# Patient Record
Sex: Male | Born: 1950
Health system: Southern US, Community
[De-identification: ages and names within clinical notes are randomized; demographics above are authoritative.]

## PROBLEM LIST (undated history)

## (undated) DIAGNOSIS — I10 Essential (primary) hypertension: Secondary | ICD-10-CM

## (undated) DIAGNOSIS — C801 Malignant (primary) neoplasm, unspecified: Secondary | ICD-10-CM

## (undated) HISTORY — PX: BACK SURGERY: SHX140

## (undated) HISTORY — PX: HEMORROIDECTOMY: SUR656

## (undated) HISTORY — PX: TRANSURETHRAL RESECTION OF PROSTATE: SHX73

---

## 1998-12-10 ENCOUNTER — Inpatient Hospital Stay (HOSPITAL_COMMUNITY): Admission: RE | Admit: 1998-12-10 | Discharge: 1998-12-11 | Payer: Self-pay | Admitting: Neurosurgery

## 2005-01-14 ENCOUNTER — Ambulatory Visit: Payer: Self-pay | Admitting: Family Medicine

## 2005-08-18 ENCOUNTER — Ambulatory Visit: Payer: Self-pay | Admitting: Family Medicine

## 2016-09-22 DIAGNOSIS — Z79899 Other long term (current) drug therapy: Secondary | ICD-10-CM | POA: Diagnosis not present

## 2016-09-22 DIAGNOSIS — E785 Hyperlipidemia, unspecified: Secondary | ICD-10-CM | POA: Diagnosis not present

## 2016-09-22 DIAGNOSIS — R739 Hyperglycemia, unspecified: Secondary | ICD-10-CM | POA: Diagnosis not present

## 2016-10-23 DIAGNOSIS — J0100 Acute maxillary sinusitis, unspecified: Secondary | ICD-10-CM | POA: Diagnosis not present

## 2016-10-23 DIAGNOSIS — R05 Cough: Secondary | ICD-10-CM | POA: Diagnosis not present

## 2016-10-23 DIAGNOSIS — R062 Wheezing: Secondary | ICD-10-CM | POA: Diagnosis not present

## 2017-01-25 DIAGNOSIS — R972 Elevated prostate specific antigen [PSA]: Secondary | ICD-10-CM | POA: Diagnosis not present

## 2017-01-25 DIAGNOSIS — N401 Enlarged prostate with lower urinary tract symptoms: Secondary | ICD-10-CM | POA: Diagnosis not present

## 2017-01-25 DIAGNOSIS — R351 Nocturia: Secondary | ICD-10-CM | POA: Diagnosis not present

## 2017-01-25 DIAGNOSIS — N3020 Other chronic cystitis without hematuria: Secondary | ICD-10-CM | POA: Diagnosis not present

## 2017-03-31 DIAGNOSIS — Z6831 Body mass index (BMI) 31.0-31.9, adult: Secondary | ICD-10-CM | POA: Diagnosis not present

## 2017-03-31 DIAGNOSIS — L049 Acute lymphadenitis, unspecified: Secondary | ICD-10-CM | POA: Diagnosis not present

## 2017-07-25 DIAGNOSIS — N318 Other neuromuscular dysfunction of bladder: Secondary | ICD-10-CM | POA: Diagnosis not present

## 2017-07-25 DIAGNOSIS — N401 Enlarged prostate with lower urinary tract symptoms: Secondary | ICD-10-CM | POA: Diagnosis not present

## 2017-07-25 DIAGNOSIS — N3020 Other chronic cystitis without hematuria: Secondary | ICD-10-CM | POA: Diagnosis not present

## 2017-07-25 DIAGNOSIS — R972 Elevated prostate specific antigen [PSA]: Secondary | ICD-10-CM | POA: Diagnosis not present

## 2017-07-26 DIAGNOSIS — Z131 Encounter for screening for diabetes mellitus: Secondary | ICD-10-CM | POA: Diagnosis not present

## 2017-07-26 DIAGNOSIS — Z6830 Body mass index (BMI) 30.0-30.9, adult: Secondary | ICD-10-CM | POA: Diagnosis not present

## 2017-07-26 DIAGNOSIS — Z79899 Other long term (current) drug therapy: Secondary | ICD-10-CM | POA: Diagnosis not present

## 2017-07-26 DIAGNOSIS — D159 Benign neoplasm of intrathoracic organ, unspecified: Secondary | ICD-10-CM | POA: Diagnosis not present

## 2017-07-26 DIAGNOSIS — Z1389 Encounter for screening for other disorder: Secondary | ICD-10-CM | POA: Diagnosis not present

## 2017-07-26 DIAGNOSIS — Z0000 Encounter for general adult medical examination without abnormal findings: Secondary | ICD-10-CM | POA: Diagnosis not present

## 2017-07-26 DIAGNOSIS — Z9181 History of falling: Secondary | ICD-10-CM | POA: Diagnosis not present

## 2017-10-19 DIAGNOSIS — L723 Sebaceous cyst: Secondary | ICD-10-CM | POA: Diagnosis not present

## 2017-10-19 DIAGNOSIS — I10 Essential (primary) hypertension: Secondary | ICD-10-CM | POA: Diagnosis not present

## 2017-10-19 DIAGNOSIS — L02212 Cutaneous abscess of back [any part, except buttock]: Secondary | ICD-10-CM | POA: Diagnosis not present

## 2018-02-01 DIAGNOSIS — N401 Enlarged prostate with lower urinary tract symptoms: Secondary | ICD-10-CM | POA: Diagnosis not present

## 2018-02-01 DIAGNOSIS — N318 Other neuromuscular dysfunction of bladder: Secondary | ICD-10-CM | POA: Diagnosis not present

## 2018-02-01 DIAGNOSIS — R972 Elevated prostate specific antigen [PSA]: Secondary | ICD-10-CM | POA: Diagnosis not present

## 2018-02-01 DIAGNOSIS — N3020 Other chronic cystitis without hematuria: Secondary | ICD-10-CM | POA: Diagnosis not present

## 2018-03-06 DIAGNOSIS — D510 Vitamin B12 deficiency anemia due to intrinsic factor deficiency: Secondary | ICD-10-CM | POA: Diagnosis not present

## 2018-03-06 DIAGNOSIS — Z6830 Body mass index (BMI) 30.0-30.9, adult: Secondary | ICD-10-CM | POA: Diagnosis not present

## 2018-03-06 DIAGNOSIS — I10 Essential (primary) hypertension: Secondary | ICD-10-CM | POA: Diagnosis not present

## 2018-03-06 DIAGNOSIS — E785 Hyperlipidemia, unspecified: Secondary | ICD-10-CM | POA: Diagnosis not present

## 2018-03-06 DIAGNOSIS — R5383 Other fatigue: Secondary | ICD-10-CM | POA: Diagnosis not present

## 2018-03-06 DIAGNOSIS — Z79899 Other long term (current) drug therapy: Secondary | ICD-10-CM | POA: Diagnosis not present

## 2018-07-27 DIAGNOSIS — Z6830 Body mass index (BMI) 30.0-30.9, adult: Secondary | ICD-10-CM | POA: Diagnosis not present

## 2018-07-27 DIAGNOSIS — Z1339 Encounter for screening examination for other mental health and behavioral disorders: Secondary | ICD-10-CM | POA: Diagnosis not present

## 2018-07-27 DIAGNOSIS — Z122 Encounter for screening for malignant neoplasm of respiratory organs: Secondary | ICD-10-CM | POA: Diagnosis not present

## 2018-07-27 DIAGNOSIS — I10 Essential (primary) hypertension: Secondary | ICD-10-CM | POA: Diagnosis not present

## 2018-07-27 DIAGNOSIS — Z0000 Encounter for general adult medical examination without abnormal findings: Secondary | ICD-10-CM | POA: Diagnosis not present

## 2018-07-27 DIAGNOSIS — D510 Vitamin B12 deficiency anemia due to intrinsic factor deficiency: Secondary | ICD-10-CM | POA: Diagnosis not present

## 2018-07-27 DIAGNOSIS — Z9119 Patient's noncompliance with other medical treatment and regimen: Secondary | ICD-10-CM | POA: Diagnosis not present

## 2018-07-27 DIAGNOSIS — Z23 Encounter for immunization: Secondary | ICD-10-CM | POA: Diagnosis not present

## 2018-08-01 DIAGNOSIS — N318 Other neuromuscular dysfunction of bladder: Secondary | ICD-10-CM | POA: Diagnosis not present

## 2018-08-01 DIAGNOSIS — N3020 Other chronic cystitis without hematuria: Secondary | ICD-10-CM | POA: Diagnosis not present

## 2018-08-01 DIAGNOSIS — R972 Elevated prostate specific antigen [PSA]: Secondary | ICD-10-CM | POA: Diagnosis not present

## 2018-08-01 DIAGNOSIS — N401 Enlarged prostate with lower urinary tract symptoms: Secondary | ICD-10-CM | POA: Diagnosis not present

## 2018-08-02 DIAGNOSIS — I700 Atherosclerosis of aorta: Secondary | ICD-10-CM | POA: Diagnosis not present

## 2018-08-02 DIAGNOSIS — J439 Emphysema, unspecified: Secondary | ICD-10-CM | POA: Diagnosis not present

## 2018-08-02 DIAGNOSIS — Z87891 Personal history of nicotine dependence: Secondary | ICD-10-CM | POA: Diagnosis not present

## 2018-08-02 DIAGNOSIS — Z122 Encounter for screening for malignant neoplasm of respiratory organs: Secondary | ICD-10-CM | POA: Diagnosis not present

## 2018-11-04 DIAGNOSIS — E782 Mixed hyperlipidemia: Secondary | ICD-10-CM | POA: Diagnosis not present

## 2018-11-04 DIAGNOSIS — I10 Essential (primary) hypertension: Secondary | ICD-10-CM | POA: Diagnosis not present

## 2019-01-10 DIAGNOSIS — E7800 Pure hypercholesterolemia, unspecified: Secondary | ICD-10-CM | POA: Diagnosis not present

## 2019-01-10 DIAGNOSIS — Z6831 Body mass index (BMI) 31.0-31.9, adult: Secondary | ICD-10-CM | POA: Diagnosis not present

## 2019-01-10 DIAGNOSIS — Z9119 Patient's noncompliance with other medical treatment and regimen: Secondary | ICD-10-CM | POA: Diagnosis not present

## 2019-01-10 DIAGNOSIS — I10 Essential (primary) hypertension: Secondary | ICD-10-CM | POA: Diagnosis not present

## 2019-01-30 DIAGNOSIS — N318 Other neuromuscular dysfunction of bladder: Secondary | ICD-10-CM | POA: Diagnosis not present

## 2019-01-30 DIAGNOSIS — N3020 Other chronic cystitis without hematuria: Secondary | ICD-10-CM | POA: Diagnosis not present

## 2019-01-30 DIAGNOSIS — R972 Elevated prostate specific antigen [PSA]: Secondary | ICD-10-CM | POA: Diagnosis not present

## 2019-01-30 DIAGNOSIS — N401 Enlarged prostate with lower urinary tract symptoms: Secondary | ICD-10-CM | POA: Diagnosis not present

## 2019-08-03 DIAGNOSIS — R972 Elevated prostate specific antigen [PSA]: Secondary | ICD-10-CM | POA: Diagnosis not present

## 2019-08-03 DIAGNOSIS — N401 Enlarged prostate with lower urinary tract symptoms: Secondary | ICD-10-CM | POA: Diagnosis not present

## 2019-08-03 DIAGNOSIS — N3020 Other chronic cystitis without hematuria: Secondary | ICD-10-CM | POA: Diagnosis not present

## 2019-08-03 DIAGNOSIS — N318 Other neuromuscular dysfunction of bladder: Secondary | ICD-10-CM | POA: Diagnosis not present

## 2019-08-08 DIAGNOSIS — Z6831 Body mass index (BMI) 31.0-31.9, adult: Secondary | ICD-10-CM | POA: Diagnosis not present

## 2019-08-08 DIAGNOSIS — D510 Vitamin B12 deficiency anemia due to intrinsic factor deficiency: Secondary | ICD-10-CM | POA: Diagnosis not present

## 2019-08-08 DIAGNOSIS — Z9119 Patient's noncompliance with other medical treatment and regimen: Secondary | ICD-10-CM | POA: Diagnosis not present

## 2019-08-08 DIAGNOSIS — Z2821 Immunization not carried out because of patient refusal: Secondary | ICD-10-CM | POA: Diagnosis not present

## 2019-08-08 DIAGNOSIS — Z9181 History of falling: Secondary | ICD-10-CM | POA: Diagnosis not present

## 2019-08-08 DIAGNOSIS — Z0000 Encounter for general adult medical examination without abnormal findings: Secondary | ICD-10-CM | POA: Diagnosis not present

## 2019-08-08 DIAGNOSIS — Z1331 Encounter for screening for depression: Secondary | ICD-10-CM | POA: Diagnosis not present

## 2019-08-08 DIAGNOSIS — I10 Essential (primary) hypertension: Secondary | ICD-10-CM | POA: Diagnosis not present

## 2019-08-08 DIAGNOSIS — E7800 Pure hypercholesterolemia, unspecified: Secondary | ICD-10-CM | POA: Diagnosis not present

## 2019-08-08 DIAGNOSIS — Z23 Encounter for immunization: Secondary | ICD-10-CM | POA: Diagnosis not present

## 2019-12-26 DIAGNOSIS — I10 Essential (primary) hypertension: Secondary | ICD-10-CM | POA: Diagnosis not present

## 2019-12-26 DIAGNOSIS — K6289 Other specified diseases of anus and rectum: Secondary | ICD-10-CM | POA: Diagnosis not present

## 2019-12-26 DIAGNOSIS — K643 Fourth degree hemorrhoids: Secondary | ICD-10-CM | POA: Diagnosis not present

## 2020-01-02 DIAGNOSIS — Z01812 Encounter for preprocedural laboratory examination: Secondary | ICD-10-CM | POA: Diagnosis not present

## 2020-01-02 DIAGNOSIS — K6289 Other specified diseases of anus and rectum: Secondary | ICD-10-CM | POA: Diagnosis not present

## 2020-01-02 DIAGNOSIS — K643 Fourth degree hemorrhoids: Secondary | ICD-10-CM | POA: Diagnosis not present

## 2020-01-02 DIAGNOSIS — Z20822 Contact with and (suspected) exposure to covid-19: Secondary | ICD-10-CM | POA: Diagnosis not present

## 2020-01-02 DIAGNOSIS — I10 Essential (primary) hypertension: Secondary | ICD-10-CM | POA: Diagnosis not present

## 2020-01-02 DIAGNOSIS — K648 Other hemorrhoids: Secondary | ICD-10-CM | POA: Diagnosis not present

## 2020-01-04 DIAGNOSIS — Z01810 Encounter for preprocedural cardiovascular examination: Secondary | ICD-10-CM | POA: Diagnosis not present

## 2020-01-07 DIAGNOSIS — I10 Essential (primary) hypertension: Secondary | ICD-10-CM | POA: Diagnosis not present

## 2020-01-07 DIAGNOSIS — K6289 Other specified diseases of anus and rectum: Secondary | ICD-10-CM | POA: Diagnosis not present

## 2020-01-07 DIAGNOSIS — K643 Fourth degree hemorrhoids: Secondary | ICD-10-CM | POA: Diagnosis not present

## 2020-01-07 DIAGNOSIS — Z87891 Personal history of nicotine dependence: Secondary | ICD-10-CM | POA: Diagnosis not present

## 2020-05-06 DIAGNOSIS — I10 Essential (primary) hypertension: Secondary | ICD-10-CM | POA: Diagnosis not present

## 2020-05-06 DIAGNOSIS — D510 Vitamin B12 deficiency anemia due to intrinsic factor deficiency: Secondary | ICD-10-CM | POA: Diagnosis not present

## 2020-05-06 DIAGNOSIS — Z6830 Body mass index (BMI) 30.0-30.9, adult: Secondary | ICD-10-CM | POA: Diagnosis not present

## 2020-05-06 DIAGNOSIS — Z9119 Patient's noncompliance with other medical treatment and regimen: Secondary | ICD-10-CM | POA: Diagnosis not present

## 2020-05-06 DIAGNOSIS — E7800 Pure hypercholesterolemia, unspecified: Secondary | ICD-10-CM | POA: Diagnosis not present

## 2020-10-02 DIAGNOSIS — R972 Elevated prostate specific antigen [PSA]: Secondary | ICD-10-CM | POA: Diagnosis not present

## 2020-10-02 DIAGNOSIS — Z1331 Encounter for screening for depression: Secondary | ICD-10-CM | POA: Diagnosis not present

## 2020-10-02 DIAGNOSIS — I10 Essential (primary) hypertension: Secondary | ICD-10-CM | POA: Diagnosis not present

## 2020-10-02 DIAGNOSIS — E7800 Pure hypercholesterolemia, unspecified: Secondary | ICD-10-CM | POA: Diagnosis not present

## 2020-10-02 DIAGNOSIS — D510 Vitamin B12 deficiency anemia due to intrinsic factor deficiency: Secondary | ICD-10-CM | POA: Diagnosis not present

## 2020-10-02 DIAGNOSIS — Z0000 Encounter for general adult medical examination without abnormal findings: Secondary | ICD-10-CM | POA: Diagnosis not present

## 2020-10-02 DIAGNOSIS — Z2821 Immunization not carried out because of patient refusal: Secondary | ICD-10-CM | POA: Diagnosis not present

## 2020-10-02 DIAGNOSIS — M1990 Unspecified osteoarthritis, unspecified site: Secondary | ICD-10-CM | POA: Diagnosis not present

## 2020-10-02 DIAGNOSIS — Z6830 Body mass index (BMI) 30.0-30.9, adult: Secondary | ICD-10-CM | POA: Diagnosis not present

## 2020-10-02 DIAGNOSIS — N529 Male erectile dysfunction, unspecified: Secondary | ICD-10-CM | POA: Diagnosis not present

## 2020-12-09 DIAGNOSIS — R972 Elevated prostate specific antigen [PSA]: Secondary | ICD-10-CM | POA: Diagnosis not present

## 2020-12-09 DIAGNOSIS — N401 Enlarged prostate with lower urinary tract symptoms: Secondary | ICD-10-CM | POA: Diagnosis not present

## 2020-12-10 ENCOUNTER — Other Ambulatory Visit: Payer: Self-pay | Admitting: Family Medicine

## 2020-12-10 ENCOUNTER — Other Ambulatory Visit: Payer: Self-pay | Admitting: Urology

## 2020-12-10 DIAGNOSIS — R972 Elevated prostate specific antigen [PSA]: Secondary | ICD-10-CM

## 2020-12-19 ENCOUNTER — Other Ambulatory Visit: Payer: Self-pay

## 2020-12-19 ENCOUNTER — Ambulatory Visit
Admission: RE | Admit: 2020-12-19 | Discharge: 2020-12-19 | Disposition: A | Discharge status: Home / Self Care | Payer: PPO | Source: Ambulatory Visit | Attending: Family Medicine | Admitting: Family Medicine

## 2020-12-19 DIAGNOSIS — R937 Abnormal findings on diagnostic imaging of other parts of musculoskeletal system: Secondary | ICD-10-CM | POA: Diagnosis not present

## 2020-12-19 DIAGNOSIS — R972 Elevated prostate specific antigen [PSA]: Secondary | ICD-10-CM

## 2020-12-19 DIAGNOSIS — K5730 Diverticulosis of large intestine without perforation or abscess without bleeding: Secondary | ICD-10-CM | POA: Diagnosis not present

## 2020-12-19 DIAGNOSIS — N400 Enlarged prostate without lower urinary tract symptoms: Secondary | ICD-10-CM | POA: Diagnosis not present

## 2020-12-19 MED ORDER — GADOBENATE DIMEGLUMINE 529 MG/ML IV SOLN
20.0000 mL | Freq: Once | INTRAVENOUS | Status: AC | PRN
  Administered 2020-12-19: 20 mL via INTRAVENOUS

## 2021-01-02 DIAGNOSIS — R972 Elevated prostate specific antigen [PSA]: Secondary | ICD-10-CM | POA: Diagnosis not present

## 2021-01-02 DIAGNOSIS — C61 Malignant neoplasm of prostate: Secondary | ICD-10-CM | POA: Diagnosis not present

## 2021-01-15 DIAGNOSIS — C61 Malignant neoplasm of prostate: Secondary | ICD-10-CM | POA: Diagnosis not present

## 2021-01-19 ENCOUNTER — Other Ambulatory Visit (HOSPITAL_COMMUNITY): Payer: Self-pay | Admitting: Urology

## 2021-01-19 DIAGNOSIS — C61 Malignant neoplasm of prostate: Secondary | ICD-10-CM

## 2021-01-26 ENCOUNTER — Ambulatory Visit (HOSPITAL_COMMUNITY)
Admission: RE | Admit: 2021-01-26 | Discharge: 2021-01-26 | Disposition: A | Discharge status: Home / Self Care | Payer: PPO | Source: Ambulatory Visit | Attending: Urology | Admitting: Urology

## 2021-01-26 ENCOUNTER — Encounter (HOSPITAL_COMMUNITY): Payer: Self-pay

## 2021-01-26 ENCOUNTER — Other Ambulatory Visit (HOSPITAL_COMMUNITY): Payer: Self-pay | Admitting: Urology

## 2021-01-26 ENCOUNTER — Other Ambulatory Visit: Payer: Self-pay

## 2021-01-26 DIAGNOSIS — K760 Fatty (change of) liver, not elsewhere classified: Secondary | ICD-10-CM | POA: Diagnosis not present

## 2021-01-26 DIAGNOSIS — C61 Malignant neoplasm of prostate: Secondary | ICD-10-CM

## 2021-01-26 DIAGNOSIS — R160 Hepatomegaly, not elsewhere classified: Secondary | ICD-10-CM | POA: Diagnosis not present

## 2021-01-26 HISTORY — DX: Malignant (primary) neoplasm, unspecified: C80.1

## 2021-01-26 HISTORY — DX: Essential (primary) hypertension: I10

## 2021-01-26 LAB — POCT I-STAT CREATININE: Creatinine, Ser: 0.9 mg/dL (ref 0.61–1.24)

## 2021-01-26 MED ORDER — AXUMIN (FLUCICLOVINE F 18) INJECTION
9.9900 | Freq: Once | INTRAVENOUS | Status: AC | PRN
  Administered 2021-01-26: 9.99 via INTRAVENOUS

## 2021-01-26 MED ORDER — IOHEXOL 300 MG/ML  SOLN
100.0000 mL | Freq: Once | INTRAMUSCULAR | Status: AC | PRN
  Administered 2021-01-26: 100 mL via INTRAVENOUS

## 2021-02-17 DIAGNOSIS — R9341 Abnormal radiologic findings on diagnostic imaging of renal pelvis, ureter, or bladder: Secondary | ICD-10-CM | POA: Diagnosis not present

## 2021-02-17 DIAGNOSIS — C61 Malignant neoplasm of prostate: Secondary | ICD-10-CM | POA: Diagnosis not present

## 2021-02-24 DIAGNOSIS — Z01818 Encounter for other preprocedural examination: Secondary | ICD-10-CM | POA: Diagnosis not present

## 2021-02-26 DIAGNOSIS — Z8546 Personal history of malignant neoplasm of prostate: Secondary | ICD-10-CM | POA: Diagnosis not present

## 2021-02-26 DIAGNOSIS — K219 Gastro-esophageal reflux disease without esophagitis: Secondary | ICD-10-CM | POA: Diagnosis not present

## 2021-02-26 DIAGNOSIS — Z6831 Body mass index (BMI) 31.0-31.9, adult: Secondary | ICD-10-CM | POA: Diagnosis not present

## 2021-02-26 DIAGNOSIS — E785 Hyperlipidemia, unspecified: Secondary | ICD-10-CM | POA: Diagnosis not present

## 2021-02-26 DIAGNOSIS — R9341 Abnormal radiologic findings on diagnostic imaging of renal pelvis, ureter, or bladder: Secondary | ICD-10-CM | POA: Diagnosis not present

## 2021-02-26 DIAGNOSIS — Z87891 Personal history of nicotine dependence: Secondary | ICD-10-CM | POA: Diagnosis not present

## 2021-02-26 DIAGNOSIS — E669 Obesity, unspecified: Secondary | ICD-10-CM | POA: Diagnosis not present

## 2021-02-26 DIAGNOSIS — I10 Essential (primary) hypertension: Secondary | ICD-10-CM | POA: Diagnosis not present

## 2021-02-26 DIAGNOSIS — Z79899 Other long term (current) drug therapy: Secondary | ICD-10-CM | POA: Diagnosis not present

## 2021-02-26 DIAGNOSIS — Q630 Accessory kidney: Secondary | ICD-10-CM | POA: Diagnosis not present

## 2021-02-26 DIAGNOSIS — R93422 Abnormal radiologic findings on diagnostic imaging of left kidney: Secondary | ICD-10-CM | POA: Diagnosis not present

## 2021-03-03 DIAGNOSIS — C61 Malignant neoplasm of prostate: Secondary | ICD-10-CM | POA: Diagnosis not present

## 2021-03-06 ENCOUNTER — Other Ambulatory Visit: Payer: Self-pay | Admitting: Urology

## 2021-03-12 NOTE — Patient Instructions (Addendum)
DUE TO COVID-19 ONLY ONE VISITOR IS ALLOWED TO COME WITH YOU AND STAY IN THE WAITING ROOM ONLY DURING PRE OP AND PROCEDURE DAY OF SURGERY. THE 1 VISITOR  MAY VISIT WITH YOU AFTER SURGERY IN YOUR PRIVATE ROOM DURING VISITING HOURS ONLY!  YOU NEED TO HAVE A COVID 19 TEST ON: 03/16/21 @ 11:30 AM , THIS TEST MUST BE DONE BEFORE SURGERY,  COVID TESTING SITE [name] [id number], IT IS ON THE RIGHT GOING OUT [address] APPROXIMATELY  2 MINUTES PAST [employer] ON THE RIGHT. ONCE YOUR COVID TEST IS COMPLETED,  PLEASE BEGIN THE QUARANTINE INSTRUCTIONS AS OUTLINED IN YOUR HANDOUT.                Joe Jones    Your procedure is scheduled on: 03/19/21   Report to [facility] Main  Entrance   Report to short stay at: 5:15 AM     Call this number if you have problems the morning of surgery [phone]    Remember: Do not eat food or drink liquids :After Midnight.   BRUSH YOUR TEETH MORNING OF SURGERY AND RINSE YOUR MOUTH OUT, NO CHEWING GUM CANDY OR MINTS.    Take these medicines the morning of surgery with A SIP OF WATER: amlodipine.                               You may not have any metal on your body including hair pins and              piercings  Do not wear jewelry, lotions, powders or perfumes, deodorant             Men may shave face and neck.   Do not bring valuables to the hospital. [name]  Contacts, dentures or bridgework may not be worn into surgery.  Leave suitcase in the car. After surgery it may be brought to your room.     Patients discharged the day of surgery will not be allowed to drive home. IF YOU ARE HAVING SURGERY AND GOING HOME THE SAME DAY, YOU MUST HAVE AN ADULT TO DRIVE YOU HOME AND BE WITH YOU FOR 24 HOURS. YOU MAY GO HOME BY TAXI OR UBER OR ORTHERWISE, BUT AN ADULT MUST ACCOMPANY YOU HOME AND STAY WITH YOU FOR 24 HOURS.  Name and phone number of your  driver:  Special Instructions: N/A              Please read over the following fact sheets you were given: ____________________________________________________________________          [facility] - Preparing for Surgery Before surgery, you can play an important role.  Because skin is not sterile, your skin needs to be as free of germs as possible.  You can reduce the number of germs on your skin by washing with CHG (chlorahexidine gluconate) soap before surgery.  CHG is an antiseptic cleaner which kills germs and bonds with the skin to continue killing germs even after washing. Please DO NOT use if you have an allergy to CHG or antibacterial soaps.  If your skin becomes reddened/irritated stop using the CHG and inform your nurse when you arrive at Short Stay. Do not shave (including legs and underarms) for at least 48 hours prior to  the first CHG shower.  You may shave your face/neck. Please follow these instructions carefully:  1.  Shower with CHG Soap the night before surgery and the  morning of Surgery.  2.  If you choose to wash your hair, wash your hair first as usual with your  normal  shampoo.  3.  After you shampoo, rinse your hair and body thoroughly to remove the  shampoo.                           4.  Use CHG as you would any other liquid soap.  You can apply chg directly  to the skin and wash                       Gently with a scrungie or clean washcloth.  5.  Apply the CHG Soap to your body ONLY FROM THE NECK DOWN.   Do not use on face/ open                           Wound or open sores. Avoid contact with eyes, ears mouth and genitals (private parts).                       Wash face,  Genitals (private parts) with your normal soap.             6.  Wash thoroughly, paying special attention to the area where your surgery  will be performed.  7.  Thoroughly rinse your body with warm water from the neck down.  8.  DO NOT shower/wash with your normal soap after using and rinsing off   the CHG Soap.                9.  Pat yourself dry with a clean towel.            10.  Wear clean pajamas.            11.  Place clean sheets on your bed the night of your first shower and do not  sleep with pets. Day of Surgery : Do not apply any lotions/deodorants the morning of surgery.  Please wear clean clothes to the hospital/surgery center.  FAILURE TO FOLLOW THESE INSTRUCTIONS MAY RESULT IN THE CANCELLATION OF YOUR SURGERY PATIENT SIGNATURE_________________________________  NURSE SIGNATURE__________________________________  ________________________________________________________________________

## 2021-03-13 ENCOUNTER — Other Ambulatory Visit: Payer: Self-pay

## 2021-03-13 ENCOUNTER — Encounter (HOSPITAL_COMMUNITY)
Admission: RE | Admit: 2021-03-13 | Discharge: 2021-03-13 | Disposition: A | Discharge status: Home / Self Care | Payer: PPO | Source: Ambulatory Visit | Attending: Urology | Admitting: Urology

## 2021-03-13 ENCOUNTER — Encounter (HOSPITAL_COMMUNITY): Payer: Self-pay

## 2021-03-13 DIAGNOSIS — Z01818 Encounter for other preprocedural examination: Secondary | ICD-10-CM | POA: Diagnosis not present

## 2021-03-13 LAB — CBC
HCT: 44 % (ref 39.0–52.0)
Hemoglobin: 15.4 g/dL (ref 13.0–17.0)
MCH: 34.2 pg — ABNORMAL HIGH (ref 26.0–34.0)
MCHC: 35 g/dL (ref 30.0–36.0)
MCV: 97.8 fL (ref 80.0–100.0)
Platelets: 304 10*3/uL (ref 150–400)
RBC: 4.5 MIL/uL (ref 4.22–5.81)
RDW: 12.7 % (ref 11.5–15.5)
WBC: 7.9 10*3/uL (ref 4.0–10.5)
nRBC: 0 % (ref 0.0–0.2)

## 2021-03-13 LAB — BASIC METABOLIC PANEL
Anion gap: 10 (ref 5–15)
BUN: 14 mg/dL (ref 8–23)
CO2: 25 mmol/L (ref 22–32)
Calcium: 9.6 mg/dL (ref 8.9–10.3)
Chloride: 102 mmol/L (ref 98–111)
Creatinine, Ser: 0.98 mg/dL (ref 0.61–1.24)
GFR, Estimated: >60 mL/min (ref >60)
Glucose, Bld: 124 mg/dL — ABNORMAL HIGH (ref 70–99)
Potassium: 4.6 mmol/L (ref 3.5–5.1)
Sodium: 137 mmol/L (ref 135–145)

## 2021-03-13 NOTE — Progress Notes (Signed)
COVID Vaccine Completed: Yes Date COVID Vaccine completed: 09/06/20 COVID vaccine manufacturer:  Moderna     PCP - Dr. Lovette Cliche Cardiologist -   Chest x-ray -  EKG -  Stress Test -  ECHO -  Cardiac Cath -  Pacemaker/ICD device last checked:  Sleep Study -  CPAP -   Fasting Blood Sugar -  Checks Blood Sugar _____ times a day  Blood Thinner Instructions: Aspirin Instructions: Last Dose:  Anesthesia review:   Patient denies shortness of breath, fever, cough and chest pain at PAT appointment   Patient verbalized understanding of instructions that were given to them at the PAT appointment. Patient was also instructed that they will need to review over the PAT instructions again at home before surgery.

## 2021-03-16 ENCOUNTER — Other Ambulatory Visit (HOSPITAL_COMMUNITY)
Admission: RE | Admit: 2021-03-16 | Discharge: 2021-03-16 | Disposition: A | Discharge status: Home / Self Care | Payer: PPO | Source: Ambulatory Visit | Attending: Urology | Admitting: Urology

## 2021-03-16 DIAGNOSIS — Z01812 Encounter for preprocedural laboratory examination: Secondary | ICD-10-CM | POA: Insufficient documentation

## 2021-03-16 DIAGNOSIS — Z20822 Contact with and (suspected) exposure to covid-19: Secondary | ICD-10-CM | POA: Insufficient documentation

## 2021-03-16 LAB — SARS CORONAVIRUS 2 (TAT 6-24 HRS): SARS Coronavirus 2: NEGATIVE

## 2021-03-17 DIAGNOSIS — M6281 Muscle weakness (generalized): Secondary | ICD-10-CM | POA: Diagnosis not present

## 2021-03-17 DIAGNOSIS — M6289 Other specified disorders of muscle: Secondary | ICD-10-CM | POA: Diagnosis not present

## 2021-03-17 DIAGNOSIS — M62838 Other muscle spasm: Secondary | ICD-10-CM | POA: Diagnosis not present

## 2021-03-17 DIAGNOSIS — C61 Malignant neoplasm of prostate: Secondary | ICD-10-CM | POA: Diagnosis not present

## 2021-03-18 NOTE — Anesthesia Preprocedure Evaluation (Addendum)
Anesthesia Evaluation  Patient identified by MRN, date of birth, ID band Patient awake    Reviewed: Allergy & Precautions, NPO status , Patient's Chart, lab work & pertinent test results  Airway Mallampati: III  TM Distance: >3 FB Neck ROM: Full    Dental  (+) Chipped,    Pulmonary former smoker,    Pulmonary exam normal breath sounds clear to auscultation       Cardiovascular hypertension, Pt. on medications Normal cardiovascular exam Rhythm:Regular Rate:Normal     Neuro/Psych negative neurological ROS  negative psych ROS   GI/Hepatic negative GI ROS, Neg liver ROS,   Endo/Other  negative endocrine ROS  Renal/GU negative Renal ROS     Musculoskeletal negative musculoskeletal ROS (+)   Abdominal (+) + obese,   Peds  Hematology HLD   Anesthesia Other Findings PROSTATE CANCER  Reproductive/Obstetrics                           Anesthesia Physical Anesthesia Plan  ASA: II  Anesthesia Plan: General   Post-op Pain Management:    Induction: Intravenous  PONV Risk Score and Plan: 3 and Ondansetron, Dexamethasone, Midazolam and Treatment may vary due to age or medical condition  Airway Management Planned: Oral ETT  Additional Equipment:   Intra-op Plan:   Post-operative Plan: Extubation in OR  Informed Consent: I have reviewed the patients History and Physical, chart, labs and discussed the procedure including the risks, benefits and alternatives for the proposed anesthesia with the patient or authorized representative who has indicated his/her understanding and acceptance.     Dental advisory given  Plan Discussed with: CRNA  Anesthesia Plan Comments:        Anesthesia Quick Evaluation

## 2021-03-18 NOTE — H&P (Signed)
Office Visit Report     03/03/2021   --------------------------------------------------------------------------------   Joe Jones  MRN: [id number]  DOB: May 12, 1951, 70 year old Male  SSN:    PRIMARY CARE:    REFERRING:  Joie Bimler, MD  PROVIDER:  Festus Aloe, M.D.  TREATING:  Raynelle Bring, M.D.  LOCATION:  [facility] [phone]     --------------------------------------------------------------------------------   CC/HPI: CC: Prostate Cancer   Physician requesting consult: Dr. Joie Bimler  PCP: Dr. Lovette Cliche   [name] is a 70 year old gentleman who was found to have an elevated PSA of 21.13 prompting urologic evaluation. He has a history of an elevated PSA and had actually undergone a biopsy in 2020 by his urologist at that time, Dr. Comer Locket. He is unsure of his PSA level at that time. An MRI of the prostate was performed on 12/19/20 and indicated 3 lesions of concern including a PI-RADS 4 lesion in the right anterior base/mid gland (ROI #1), a PI-RADS 3 lesion at the left anterior prostate (ROI #2), and a PI-RADS 3 lesion at the left transition zone (ROI #3). He was referred to [facility] for an MR/US fusion biopsy by Dr. Claudia Desanctis on 01/02/21 that confirmed Gleason 4+3=7 adenocarcinoma with 6 out of 23 biopsies positive for malignancy including 3 out of 4 targeted biopsies of ROI #1, 1 out of 3 biopsies of ROI #2, and 2 out of 12 systematic biopsies.   Staging studies were performed including a CT scan of the abdomen and pelvis on 01/26/21 that demonstrated a questionable left renal pelvic filling defect as well as sclerotic lesions on the right iliac and right pubic bone concerning for possible metastatic disease. He then also underwent a fluciclovine PET scan later the same day that did not show any uptake outside the prostate that would be concerning for metastatic disease.   He apparently underwent left retrograde pyelography last week by  Dr. Nila Nephew. According to the patient, this was unremarkable for any concerning findings but I do not have those records.   Family history: None.   Imaging studies:  CT abd/pelvis (2/21/221): Findings as above.  Fluciclovine PET scan (01/26/21): Negative for metastatic disease.   PMH: He has a history of hypertension and hyperlipidemia.  PSH: TURP in 2011.   TNM stage: cT1c Nx M?  PSA: 21.13  Gleason score: 4+3=7  Biopsy (01/02/21): 6/23 cores positive  Left: Benign  Right: R mid (5%, 3+4=7), R lateral mid (< 5%, 3+4=7)  Targeted: ROI #1 (3/4 positive - 60%, 50%, 20%, 4+3=7), ROI # 2 - benign, ROI # 3 - (1/3 cores - 10%, 3+4=7)  Prostate volume: 49.0 cc   Nomogram  OC disease: 15%  EPE: 83%  SVI: 9%  LNI: 21%  PFS (5 year, 10 year): 32%, 20%   Urinary function: IPSS is 5.  Erectile function: SHIM score is 10.     ALLERGIES: No Known Drug Allergies    MEDICATIONS: Aspirin 81 mg tablet,chewable  Hydrochlorothiazide 25 mg tablet  Lisinopril 10 mg tablet  Amlodipine Besylate 10 mg tablet  Rosuvastatin Calcium 10 mg tablet  Vitamin B12     GU PSH: Prostate Needle Biopsy - 01/02/2021     NON-GU PSH: Surgical Pathology, Gross And Microscopic Examination For Prostate Needle - 01/02/2021     GU PMH: Elevated PSA - 01/02/2021    NON-GU PMH: No Non-GU PMH    FAMILY HISTORY: No Family History    SOCIAL HISTORY:  No Social History    REVIEW OF SYSTEMS:    GU Review Male:   Patient denies frequent urination, hard to postpone urination, burning/ pain with urination, get up at night to urinate, leakage of urine, stream starts and stops, trouble starting your streams, and have to strain to urinate .  Gastrointestinal (Upper):   Patient denies nausea and vomiting.  Gastrointestinal (Lower):   Patient denies diarrhea and constipation.  Constitutional:   Patient denies fever, night sweats, weight loss, and fatigue.  Skin:   Patient denies skin rash/ lesion and itching.  Eyes:    Patient denies double vision and blurred vision.  Ears/ Nose/ Throat:   Patient denies sore throat and sinus problems.  Hematologic/Lymphatic:   Patient denies swollen glands and easy bruising.  Cardiovascular:   Patient denies leg swelling and chest pains.  Respiratory:   Patient denies cough and shortness of breath.  Endocrine:   Patient denies excessive thirst.  Musculoskeletal:   Patient denies back pain and joint pain.  Neurological:   Patient denies headaches and dizziness.  Psychologic:   Patient denies depression and anxiety.   VITAL SIGNS:      03/03/2021 03:02 PM  Weight 240 lb / 108.86 kg  Height 72 in / 182.88 cm  BP 138/81 mmHg  Pulse 86 /min  Temperature 98.0 F / 36.6 C  BMI 32.5 kg/m   MULTI-SYSTEM PHYSICAL EXAMINATION:    Constitutional: Well-nourished. No physical deformities. Normally developed. Good grooming.  Neck: Neck symmetrical, not swollen. Normal tracheal position.  Respiratory: No labored breathing, no use of accessory muscles. Clear bilaterally.  Cardiovascular: Normal temperature, normal extremity pulses, no swelling, no varicosities. RRR.  Lymphatic: No enlargement of neck, axillae, groin.  Skin: No paleness, no jaundice, no cyanosis. No lesion, no ulcer, no rash.  Neurologic / Psychiatric: Oriented to time, oriented to place, oriented to person. No depression, no anxiety, no agitation.  Gastrointestinal: No mass, no tenderness, no rigidity, non obese abdomen.  Eyes: Normal conjunctivae. Normal eyelids.  Ears, Nose, Mouth, and Throat: Left ear no scars, no lesions, no masses. Right ear no scars, no lesions, no masses. Nose no scars, no lesions, no masses. Normal hearing. Normal lips.  Musculoskeletal: Normal gait and station of head and neck.     Complexity of Data:  Lab Test Review:   PSA  Records Review:   Previous Patient Records  X-Ray Review: PET Scan: Reviewed Films.  C.T. Abdomen/Pelvis: Reviewed Films.    Notes:                      CLINICAL DATA: Prostate cancer.   EXAM:  CT ABDOMEN AND PELVIS WITH CONTRAST   TECHNIQUE:  Multidetector CT imaging of the abdomen and pelvis was performed  using the standard protocol following bolus administration of  intravenous contrast.   CONTRAST: 174mL OMNIPAQUE IOHEXOL 300 MG/ML SOLN   COMPARISON: MR prostate 12/19/2020.   FINDINGS:  Lower chest: Lung bases are clear. Atherosclerotic calcification of  the aorta, aortic valve and coronary arteries. Heart size within  normal limits. No pericardial or pleural effusion. Distal esophagus  is grossly unremarkable. Distal periesophageal lymph nodes are  subcentimeter in size.   Hepatobiliary: Liver is decreased in attenuation diffusely and is  enlarged, measuring 21.1 cm. Subcentimeter low-attenuation lesions  are too small to characterize. Gallbladder is unremarkable. No  biliary ductal dilatation.   Pancreas: Negative.   Spleen: Negative.   Adrenals/Urinary Tract: Adrenal glands are unremarkable.  Subcentimeter  low-attenuation lesions in the kidneys are too small  to characterize but statistically, cysts are likely. A 2.5 cm fluid  density lesion in the interpolar left kidney is likely a cyst.  Possible rounded filling defect in the left renal pelvis on  nephrographic phase imaging (7/20-21). Ureters are decompressed.  Bladder is indented by the prostate.   Stomach/Bowel: Tiny hiatal hernia. Stomach is decompressed. Stomach,  small bowel, appendix and colon are otherwise unremarkable.   Vascular/Lymphatic: Atherosclerotic calcification of the aorta.  Periportal and retroperitoneal lymph nodes are not enlarged by CT  size criteria.   Reproductive: Prostate is enlarged and nodular.   Other: No free fluid. Right inguinal hernia contains fat.  Mesenteries and peritoneum are otherwise unremarkable.   Musculoskeletal: There are areas of sclerosis in the medial right  iliac wing (2/72) and right pubic bone (2/94).    IMPRESSION:  1. Nodular enlarged prostate, better evaluated on MR 12/19/2020.  Sclerotic lesions in the right iliac wing and right pubic bone,  worrisome for metastatic disease.  2. Possible rounded filling defect in the left renal pelvis.  Urothelial carcinoma cannot be excluded. These results will be  called to the ordering clinician or representative by the  Radiologist Assistant, and communication documented in the PACS or  [employer].  3. Steatotic enlarged liver.  4. Aortic atherosclerosis (ICD10-I70.0). Coronary artery  calcification.    Electronically Signed  By: Lorin Picket M.D.  On: 01/26/2021 08:53   CLINICAL DATA: Prostate carcinoma with biochemical recurrence.   EXAM:  NUCLEAR MEDICINE PET SKULL BASE TO THIGH   TECHNIQUE:  10.0 mCi F18 Axumin (Fluciclovine) was injected intravenously.  Full-ring PET imaging was performed from the skull base to thigh  after the radiotracer. CT data was obtained and used for attenuation  correction and anatomic localization.   COMPARISON: None.   FINDINGS:  NECK   No radiotracer activity in neck lymph nodes.   Incidental CT finding: None   CHEST   No radiotracer accumulation within mediastinal or hilar lymph nodes.  No suspicious pulmonary nodules on the CT scan.   Incidental CT finding: None   ABDOMEN/PELVIS   Prostate: Intense activity within the prostate gland. Activity  involves the entirety of the RIGHT peripheral zone and the posterior  aspect of the LEFT peripheral zone. Activity is relatively intense  with SUV max equal 8.4.   Lymph nodes: Low level activity associated with a distal LEFT  external iliac lymph node measuring 5 mm (182/4). Small more central  LEFT external iliac lymph node measuring 5 mm on image 175/4 without  significant radiotracer activity.   No abnormal radiotracer accumulation or pathologically enlarged  lymph nodes in the periaortic retroperitoneum.   Liver: No evidence  of liver metastasis   Incidental CT finding: Diffuse low-attenuation liver consistent  hepatic steatosis.   SKELETON   A diffuse activity within the marrow space is favored benign.   IMPRESSION:  1. Intense uptake within the prostate gland could represent prostate  adenocarcinoma.  2. No evidence of metastatic adenopathy within the pelvis or  periaortic retroperitoneum.  3. No evidence of visceral metastasis or skeletal metastasis.  4. Hepatic steatosis.    Electronically Signed  By: Suzy Bouchard M.D.  On: 01/27/2021 14:18   PROCEDURES:          Urinalysis Dipstick Dipstick Cont'd  Color: Yellow Bilirubin: Neg mg/dL  Appearance: Clear Ketones: Neg mg/dL  Specific Gravity: 1.015 Blood: Neg ery/uL  pH: 8.0 Protein: Trace mg/dL  Glucose: Neg  mg/dL Urobilinogen: 0.2 mg/dL    Nitrites: Neg    Leukocyte Esterase: Neg leu/uL    ASSESSMENT:      ICD-10 Details  1 GU:   Prostate Cancer - C61    PLAN:           Schedule Return Visit/Planned Activity: Next Available Appointment - PT Referral             Note: Please schedule patient for preoperative PT prior to radical prostatectomy.    Return Visit/Planned Activity: Other See Visit Notes             Note: Will call to schedule surgery.          Document Letter(s):  Created for Patient: Clinical Summary         Notes:   1. High-risk prostate cancer: I had a detailed discussion with Mr. and Mrs. Molnar today. I did recommend therapy of curative intent considering his age/life expectancy and his disease parameters. The patient was counseled about the natural history of prostate cancer and the standard treatment options that are available for prostate cancer. It was explained to him how his age and life expectancy, clinical stage, Gleason score, and PSA affect his prognosis, the decision to proceed with additional staging studies, as well as how that information influences recommended treatment strategies. We discussed the  roles for active surveillance, radiation therapy, surgical therapy, androgen deprivation, as well as ablative therapy options for the treatment of prostate cancer as appropriate to his individual cancer situation. We discussed the risks and benefits of these options with regard to their impact on cancer control and also in terms of potential adverse events, complications, and impact on quality of life particularly related to urinary and sexual function. The patient was encouraged to ask questions throughout the discussion today and all questions were answered to his stated satisfaction. In addition, the patient was provided with and/or directed to appropriate resources and literature for further education about prostate cancer and treatment options. We discussed surgical therapy for prostate cancer including the different available surgical approaches. We discussed, in detail, the risks and expectations of surgery with regard to cancer control, urinary control, and erectile function as well as the expected postoperative recovery process. Additional risks of surgery including but not limited to bleeding, infection, hernia formation, nerve damage, lymphocele formation, bowel/rectal injury potentially necessitating colostomy, damage to the urinary tract resulting in urine leakage, urethral stricture, and the cardiopulmonary risks such as myocardial infarction, stroke, death, venothromboembolism, etc. were explained. The risk of open surgical conversion for robotic/laparoscopic prostatectomy was also discussed.   He does elect to proceed with surgical treatment and will be scheduled for a bilateral nerve-sparing robot assisted laparoscopic radical prostatectomy and bilateral pelvic lymphadenectomy. We will be prepared considering his prior TURP. I will confirm his evaluation for his left renal pelvic filling defect per Dr. Ara Kussmaul evaluation from last week.   2. Left renal pelvic filling defect: This is apparent on  his CT scan but reportedly has been evaluated endoscopically. I can visualize his images from his retrograde pyelogram that do not suggest any concerning findings but will confirm this with Dr. Ara Kussmaul notes and review of his operative note.   Cc: Dr. Joie Bimler         Next Appointment:      Next Appointment: 03/17/2021 04:00 PM    Appointment Type: 36 Physical Therapy    Location: [facility] [phone]    Provider: Mertha Finders,  PT    Reason for Visit: pt eval      E & M CODES: We spent 74 minutes dedicated to evaluation and management time, including face to face interaction, discussions on coordination of care, documentation, result review, and discussion with others as applicable.     * Signed by Raynelle Bring, M.D. on 03/03/21 at 8:30 PM (EDT)*       APPENDED NOTES:  Received Dr. Ara Kussmaul operative note and no left renal pelvic filling defect was noted on retrograde pyelography.     * Signed by Raynelle Bring, M.D. on 03/05/21 at 2:02 PM (ED*

## 2021-03-19 ENCOUNTER — Other Ambulatory Visit: Payer: Self-pay

## 2021-03-19 ENCOUNTER — Encounter (HOSPITAL_COMMUNITY): Payer: Self-pay | Admitting: Urology

## 2021-03-19 ENCOUNTER — Ambulatory Visit (HOSPITAL_COMMUNITY): Payer: PPO | Admitting: Anesthesiology

## 2021-03-19 ENCOUNTER — Encounter (HOSPITAL_COMMUNITY)
Admission: RE | Disposition: A | Discharge status: Home / Self Care | Payer: Self-pay | Source: Other Acute Inpatient Hospital | Attending: Urology

## 2021-03-19 ENCOUNTER — Observation Stay (HOSPITAL_COMMUNITY)
Admission: RE | Admit: 2021-03-19 | Discharge: 2021-03-20 | Disposition: A | Discharge status: Home / Self Care | Payer: PPO | Source: Other Acute Inpatient Hospital | Attending: Urology | Admitting: Urology

## 2021-03-19 DIAGNOSIS — Z7982 Long term (current) use of aspirin: Secondary | ICD-10-CM | POA: Insufficient documentation

## 2021-03-19 DIAGNOSIS — I10 Essential (primary) hypertension: Secondary | ICD-10-CM | POA: Insufficient documentation

## 2021-03-19 DIAGNOSIS — E785 Hyperlipidemia, unspecified: Secondary | ICD-10-CM | POA: Diagnosis not present

## 2021-03-19 DIAGNOSIS — C61 Malignant neoplasm of prostate: Principal | ICD-10-CM | POA: Diagnosis present

## 2021-03-19 DIAGNOSIS — Z79899 Other long term (current) drug therapy: Secondary | ICD-10-CM | POA: Insufficient documentation

## 2021-03-19 HISTORY — PX: ROBOT ASSISTED LAPAROSCOPIC RADICAL PROSTATECTOMY: SHX5141

## 2021-03-19 HISTORY — PX: LYMPHADENECTOMY: SHX5960

## 2021-03-19 LAB — HEMOGLOBIN AND HEMATOCRIT, BLOOD
HCT: 38.3 % — ABNORMAL LOW (ref 39.0–52.0)
Hemoglobin: 13.2 g/dL (ref 13.0–17.0)

## 2021-03-19 LAB — TYPE AND SCREEN
ABO/RH(D): A POS
Antibody Screen: NEGATIVE

## 2021-03-19 LAB — ABO/RH: ABO/RH(D): A POS

## 2021-03-19 SURGERY — XI ROBOTIC ASSISTED LAPAROSCOPIC RADICAL PROSTATECTOMY LEVEL 3
Anesthesia: General

## 2021-03-19 MED ORDER — PROPOFOL 10 MG/ML IV BOLUS
INTRAVENOUS | Status: AC
  Filled 2021-03-19: qty 20

## 2021-03-19 MED ORDER — DEXAMETHASONE SODIUM PHOSPHATE 10 MG/ML IJ SOLN
INTRAMUSCULAR | Status: DC | PRN
  Administered 2021-03-19: 10 mg via INTRAVENOUS

## 2021-03-19 MED ORDER — LIDOCAINE 2% (20 MG/ML) 5 ML SYRINGE
INTRAMUSCULAR | Status: DC | PRN
  Administered 2021-03-19: 80 mg via INTRAVENOUS

## 2021-03-19 MED ORDER — CEFAZOLIN SODIUM-DEXTROSE 2-4 GM/100ML-% IV SOLN
2.0000 g | Freq: Once | INTRAVENOUS | Status: AC
  Administered 2021-03-19: 2 g via INTRAVENOUS
  Filled 2021-03-19: qty 100

## 2021-03-19 MED ORDER — SULFAMETHOXAZOLE-TRIMETHOPRIM 800-160 MG PO TABS: 1.0000 | ORAL_TABLET | Freq: Two times a day (BID) | ORAL | 0 refills | Status: DC

## 2021-03-19 MED ORDER — ROCURONIUM BROMIDE 10 MG/ML (PF) SYRINGE
PREFILLED_SYRINGE | INTRAVENOUS | Status: AC
  Filled 2021-03-19: qty 20

## 2021-03-19 MED ORDER — MAGNESIUM CITRATE PO SOLN: 1.0000 | Freq: Once | ORAL | Status: DC

## 2021-03-19 MED ORDER — ONDANSETRON HCL 4 MG/2ML IJ SOLN
INTRAMUSCULAR | Status: DC | PRN
  Administered 2021-03-19: 4 mg via INTRAVENOUS

## 2021-03-19 MED ORDER — DEXAMETHASONE SODIUM PHOSPHATE 10 MG/ML IJ SOLN
INTRAMUSCULAR | Status: AC
  Filled 2021-03-19: qty 1

## 2021-03-19 MED ORDER — FLEET ENEMA 7-19 GM/118ML RE ENEM: 1.0000 | ENEMA | Freq: Once | RECTAL | Status: DC

## 2021-03-19 MED ORDER — LIDOCAINE HCL (PF) 2 % IJ SOLN
INTRAMUSCULAR | Status: DC | PRN
  Administered 2021-03-19: 1 mg/kg/h via INTRADERMAL

## 2021-03-19 MED ORDER — LISINOPRIL 20 MG PO TABS: 20.0000 mg | ORAL_TABLET | Freq: Every day | ORAL | Status: DC

## 2021-03-19 MED ORDER — ACETAMINOPHEN 325 MG PO TABS: 650.0000 mg | ORAL_TABLET | ORAL | Status: DC | PRN

## 2021-03-19 MED ORDER — AMLODIPINE BESYLATE 10 MG PO TABS: 10.0000 mg | ORAL_TABLET | Freq: Every day | ORAL | Status: DC

## 2021-03-19 MED ORDER — BACITRACIN-NEOMYCIN-POLYMYXIN 400-5-5000 EX OINT: 1.0000 "application " | TOPICAL_OINTMENT | Freq: Three times a day (TID) | CUTANEOUS | Status: DC | PRN

## 2021-03-19 MED ORDER — FENTANYL CITRATE (PF) 100 MCG/2ML IJ SOLN
25.0000 ug | INTRAMUSCULAR | Status: DC | PRN
  Administered 2021-03-19: 50 ug via INTRAVENOUS

## 2021-03-19 MED ORDER — BUPIVACAINE-EPINEPHRINE (PF) 0.25% -1:200000 IJ SOLN
INTRAMUSCULAR | Status: AC
  Filled 2021-03-19: qty 30

## 2021-03-19 MED ORDER — ONDANSETRON HCL 4 MG/2ML IJ SOLN: 4.0000 mg | Freq: Once | INTRAMUSCULAR | Status: DC | PRN

## 2021-03-19 MED ORDER — FENTANYL CITRATE (PF) 100 MCG/2ML IJ SOLN
INTRAMUSCULAR | Status: AC
  Filled 2021-03-19: qty 2

## 2021-03-19 MED ORDER — EPHEDRINE 5 MG/ML INJ
INTRAVENOUS | Status: AC
  Filled 2021-03-19: qty 20

## 2021-03-19 MED ORDER — AMISULPRIDE (ANTIEMETIC) 5 MG/2ML IV SOLN: 10.0000 mg | Freq: Once | INTRAVENOUS | Status: DC | PRN

## 2021-03-19 MED ORDER — SODIUM CHLORIDE 0.9 % IR SOLN
Status: DC | PRN
  Administered 2021-03-19: 1000 mL via INTRAVESICAL

## 2021-03-19 MED ORDER — HEPARIN SODIUM (PORCINE) 1000 UNIT/ML IJ SOLN
INTRAMUSCULAR | Status: AC
  Filled 2021-03-19: qty 1

## 2021-03-19 MED ORDER — KCL IN DEXTROSE-NACL 20-5-0.45 MEQ/L-%-% IV SOLN
INTRAVENOUS | Status: DC
  Filled 2021-03-19 (×3): qty 1000

## 2021-03-19 MED ORDER — KETAMINE HCL 10 MG/ML IJ SOLN
INTRAMUSCULAR | Status: DC | PRN
  Administered 2021-03-19: 40 mg via INTRAVENOUS

## 2021-03-19 MED ORDER — CHLORHEXIDINE GLUCONATE CLOTH 2 % EX PADS: 6.0000 | MEDICATED_PAD | Freq: Every day | CUTANEOUS | Status: DC

## 2021-03-19 MED ORDER — ORAL CARE MOUTH RINSE: 15.0000 mL | Freq: Once | OROMUCOSAL | Status: AC

## 2021-03-19 MED ORDER — TRAMADOL HCL 50 MG PO TABS: 50.0000 mg | ORAL_TABLET | Freq: Four times a day (QID) | ORAL | 0 refills | Status: DC | PRN

## 2021-03-19 MED ORDER — KETOROLAC TROMETHAMINE 15 MG/ML IJ SOLN
15.0000 mg | Freq: Four times a day (QID) | INTRAMUSCULAR | Status: DC
  Administered 2021-03-19 – 2021-03-20 (×3): 15 mg via INTRAVENOUS
  Filled 2021-03-19 (×3): qty 1

## 2021-03-19 MED ORDER — BELLADONNA ALKALOIDS-OPIUM 16.2-60 MG RE SUPP
1.0000 | Freq: Four times a day (QID) | RECTAL | Status: DC | PRN
  Administered 2021-03-19: 1 via RECTAL
  Filled 2021-03-19: qty 1

## 2021-03-19 MED ORDER — MIDAZOLAM HCL 2 MG/2ML IJ SOLN
INTRAMUSCULAR | Status: AC
  Filled 2021-03-19: qty 2

## 2021-03-19 MED ORDER — LIDOCAINE HCL 2 % IJ SOLN
INTRAMUSCULAR | Status: AC
  Filled 2021-03-19: qty 20

## 2021-03-19 MED ORDER — SUGAMMADEX SODIUM 200 MG/2ML IV SOLN
INTRAVENOUS | Status: DC | PRN
  Administered 2021-03-19: 200 mg via INTRAVENOUS

## 2021-03-19 MED ORDER — MORPHINE SULFATE (PF) 4 MG/ML IV SOLN: 2.0000 mg | INTRAVENOUS | Status: DC | PRN

## 2021-03-19 MED ORDER — FENTANYL CITRATE (PF) 100 MCG/2ML IJ SOLN
INTRAMUSCULAR | Status: DC | PRN
  Administered 2021-03-19: 50 ug via INTRAVENOUS
  Administered 2021-03-19: 100 ug via INTRAVENOUS
  Administered 2021-03-19: 50 ug via INTRAVENOUS

## 2021-03-19 MED ORDER — HEPARIN SODIUM (PORCINE) 1000 UNIT/ML IJ SOLN: INTRAMUSCULAR | Status: DC | PRN

## 2021-03-19 MED ORDER — PROPOFOL 10 MG/ML IV BOLUS
INTRAVENOUS | Status: DC | PRN
  Administered 2021-03-19 (×2): 100 mg via INTRAVENOUS

## 2021-03-19 MED ORDER — DOCUSATE SODIUM 100 MG PO CAPS
100.0000 mg | ORAL_CAPSULE | Freq: Two times a day (BID) | ORAL | Status: DC
  Administered 2021-03-19 – 2021-03-20 (×2): 100 mg via ORAL
  Filled 2021-03-19 (×2): qty 1

## 2021-03-19 MED ORDER — ZOLPIDEM TARTRATE 5 MG PO TABS: 5.0000 mg | ORAL_TABLET | Freq: Every evening | ORAL | Status: DC | PRN

## 2021-03-19 MED ORDER — SODIUM CHLORIDE 0.9 % IV BOLUS
1000.0000 mL | Freq: Once | INTRAVENOUS | Status: AC
  Administered 2021-03-19: 1000 mL via INTRAVENOUS

## 2021-03-19 MED ORDER — ONDANSETRON HCL 4 MG/2ML IJ SOLN
INTRAMUSCULAR | Status: AC
  Filled 2021-03-19: qty 2

## 2021-03-19 MED ORDER — BUPIVACAINE-EPINEPHRINE 0.25% -1:200000 IJ SOLN
INTRAMUSCULAR | Status: DC | PRN
  Administered 2021-03-19: 9 mL

## 2021-03-19 MED ORDER — ROCURONIUM BROMIDE 10 MG/ML (PF) SYRINGE
PREFILLED_SYRINGE | INTRAVENOUS | Status: DC | PRN
  Administered 2021-03-19 (×2): 20 mg via INTRAVENOUS
  Administered 2021-03-19: 60 mg via INTRAVENOUS
  Administered 2021-03-19: 20 mg via INTRAVENOUS

## 2021-03-19 MED ORDER — LACTATED RINGERS IV SOLN: INTRAVENOUS | Status: DC

## 2021-03-19 MED ORDER — HYDROCHLOROTHIAZIDE 25 MG PO TABS: 25.0000 mg | ORAL_TABLET | Freq: Every morning | ORAL | Status: DC

## 2021-03-19 MED ORDER — DOCUSATE SODIUM 100 MG PO CAPS: 100.0000 mg | ORAL_CAPSULE | Freq: Two times a day (BID) | ORAL | Status: DC

## 2021-03-19 MED ORDER — INDIGOTINDISULFONATE SODIUM 8 MG/ML IJ SOLN
INTRAMUSCULAR | Status: AC
  Filled 2021-03-19: qty 5

## 2021-03-19 MED ORDER — MIDAZOLAM HCL 5 MG/5ML IJ SOLN
INTRAMUSCULAR | Status: DC | PRN
  Administered 2021-03-19: 2 mg via INTRAVENOUS

## 2021-03-19 MED ORDER — CEFAZOLIN SODIUM-DEXTROSE 1-4 GM/50ML-% IV SOLN
1.0000 g | Freq: Three times a day (TID) | INTRAVENOUS | Status: AC
  Administered 2021-03-19 (×2): 1 g via INTRAVENOUS
  Filled 2021-03-19 (×2): qty 50

## 2021-03-19 MED ORDER — LIDOCAINE 2% (20 MG/ML) 5 ML SYRINGE
INTRAMUSCULAR | Status: AC
  Filled 2021-03-19: qty 5

## 2021-03-19 MED ORDER — CHLORHEXIDINE GLUCONATE 0.12 % MT SOLN
15.0000 mL | Freq: Once | OROMUCOSAL | Status: AC
  Administered 2021-03-19: 15 mL via OROMUCOSAL

## 2021-03-19 MED ORDER — INDIGOTINDISULFONATE SODIUM 8 MG/ML IJ SOLN
INTRAMUSCULAR | Status: DC | PRN
  Administered 2021-03-19: 5 mL via INTRAVENOUS

## 2021-03-19 MED ORDER — KETAMINE HCL 10 MG/ML IJ SOLN
INTRAMUSCULAR | Status: AC
  Filled 2021-03-19: qty 1

## 2021-03-19 MED ORDER — ACETAMINOPHEN 500 MG PO TABS
1000.0000 mg | ORAL_TABLET | Freq: Once | ORAL | Status: AC
  Administered 2021-03-19: 1000 mg via ORAL
  Filled 2021-03-19: qty 2

## 2021-03-19 MED ORDER — EPHEDRINE SULFATE-NACL 50-0.9 MG/10ML-% IV SOSY
PREFILLED_SYRINGE | INTRAVENOUS | Status: DC | PRN
  Administered 2021-03-19: 10 mg via INTRAVENOUS

## 2021-03-19 MED ORDER — DIPHENHYDRAMINE HCL 12.5 MG/5ML PO ELIX: 12.5000 mg | ORAL_SOLUTION | Freq: Four times a day (QID) | ORAL | Status: DC | PRN

## 2021-03-19 MED ORDER — ONDANSETRON HCL 4 MG/2ML IJ SOLN: 4.0000 mg | INTRAMUSCULAR | Status: DC | PRN

## 2021-03-19 MED ORDER — DIPHENHYDRAMINE HCL 50 MG/ML IJ SOLN: 12.5000 mg | Freq: Four times a day (QID) | INTRAMUSCULAR | Status: DC | PRN

## 2021-03-19 MED ORDER — ROCURONIUM BROMIDE 10 MG/ML (PF) SYRINGE
PREFILLED_SYRINGE | INTRAVENOUS | Status: AC
  Filled 2021-03-19: qty 10

## 2021-03-19 MED ORDER — ROSUVASTATIN CALCIUM 10 MG PO TABS
10.0000 mg | ORAL_TABLET | Freq: Every day | ORAL | Status: DC
  Administered 2021-03-19: 10 mg via ORAL
  Filled 2021-03-19: qty 1

## 2021-03-19 MED ORDER — STERILE WATER FOR IRRIGATION IR SOLN
Status: DC | PRN
  Administered 2021-03-19: 1000 mL

## 2021-03-19 MED ORDER — FENTANYL CITRATE (PF) 100 MCG/2ML IJ SOLN
INTRAMUSCULAR | Status: AC
  Administered 2021-03-19: 50 ug via INTRAVENOUS
  Filled 2021-03-19: qty 2

## 2021-03-19 SURGICAL SUPPLY — 67 items
ADH SKN CLS APL DERMABOND .7 (GAUZE/BANDAGES/DRESSINGS) ×2
APL PRP STRL LF DISP 70% ISPRP (MISCELLANEOUS) ×2
APL SWBSTK 6 STRL LF DISP (MISCELLANEOUS) ×2
APPLICATOR COTTON TIP 6 STRL (MISCELLANEOUS) ×2 IMPLANT
APPLICATOR COTTON TIP 6IN STRL (MISCELLANEOUS) ×3
CATH FOLEY 2WAY SLVR 18FR 30CC (CATHETERS) ×3 IMPLANT
CATH ROBINSON RED A/P 16FR (CATHETERS) ×3 IMPLANT
CATH ROBINSON RED A/P 8FR (CATHETERS) ×3 IMPLANT
CATH TIEMANN FOLEY 18FR 5CC (CATHETERS) ×3 IMPLANT
CHLORAPREP W/TINT 26 (MISCELLANEOUS) ×3 IMPLANT
CLIP VESOLOCK LG 6/CT PURPLE (CLIP) ×6 IMPLANT
COVER SURGICAL LIGHT HANDLE (MISCELLANEOUS) ×3 IMPLANT
COVER TIP SHEARS 8 DVNC (MISCELLANEOUS) ×2 IMPLANT
COVER TIP SHEARS 8MM DA VINCI (MISCELLANEOUS) ×3
COVER WAND RF STERILE (DRAPES) ×2 IMPLANT
CUTTER ECHEON FLEX ENDO 45 340 (ENDOMECHANICALS) ×3 IMPLANT
DECANTER SPIKE VIAL GLASS SM (MISCELLANEOUS) ×3 IMPLANT
DERMABOND ADVANCED (GAUZE/BANDAGES/DRESSINGS) ×1
DERMABOND ADVANCED .7 DNX12 (GAUZE/BANDAGES/DRESSINGS) ×2 IMPLANT
DRAIN CHANNEL RND F F (WOUND CARE) IMPLANT
DRAPE ARM DVNC X/XI (DISPOSABLE) ×8 IMPLANT
DRAPE COLUMN DVNC XI (DISPOSABLE) ×2 IMPLANT
DRAPE DA VINCI XI ARM (DISPOSABLE) ×12
DRAPE DA VINCI XI COLUMN (DISPOSABLE) ×3
DRAPE SURG IRRIG POUCH 19X23 (DRAPES) ×3 IMPLANT
DRSG TEGADERM 4X4.75 (GAUZE/BANDAGES/DRESSINGS) ×3 IMPLANT
ELECT PENCIL ROCKER SW 15FT (MISCELLANEOUS) ×3 IMPLANT
ELECT REM PT RETURN 15FT ADLT (MISCELLANEOUS) ×3 IMPLANT
GLOVE SURG ENC MOIS LTX SZ6.5 (GLOVE) ×3 IMPLANT
GLOVE SURG ENC TEXT LTX SZ7.5 (GLOVE) ×6 IMPLANT
GOWN STRL REUS W/TWL LRG LVL3 (GOWN DISPOSABLE) ×9 IMPLANT
HEMOSTAT SURGICEL 4X8 (HEMOSTASIS) ×3 IMPLANT
HOLDER FOLEY CATH W/STRAP (MISCELLANEOUS) ×3 IMPLANT
IRRIG SUCT STRYKERFLOW 2 WTIP (MISCELLANEOUS) ×3
IRRIGATION SUCT STRKRFLW 2 WTP (MISCELLANEOUS) ×2 IMPLANT
IV LACTATED RINGERS 1000ML (IV SOLUTION) ×3 IMPLANT
KIT TURNOVER KIT A (KITS) ×3 IMPLANT
NDL SAFETY ECLIPSE 18X1.5 (NEEDLE) ×2 IMPLANT
NEEDLE HYPO 18GX1.5 SHARP (NEEDLE) ×3
PACK ROBOT UROLOGY CUSTOM (CUSTOM PROCEDURE TRAY) ×3 IMPLANT
PENCIL SMOKE EVACUATOR (MISCELLANEOUS) IMPLANT
SEAL CANN UNIV 5-8 DVNC XI (MISCELLANEOUS) ×8 IMPLANT
SEAL XI 5MM-8MM UNIVERSAL (MISCELLANEOUS) ×12
SET TUBE SMOKE EVAC HIGH FLOW (TUBING) ×3 IMPLANT
SOLUTION ELECTROLUBE (MISCELLANEOUS) ×3 IMPLANT
SPONGE GAUZE 2X2 8PLY STRL LF (GAUZE/BANDAGES/DRESSINGS) ×1 IMPLANT
STAPLE RELOAD 45 GRN (STAPLE) ×2 IMPLANT
STAPLE RELOAD 45MM GREEN (STAPLE) ×3
SUT ETHILON 3 0 PS 1 (SUTURE) ×3 IMPLANT
SUT MNCRL 3 0 RB1 (SUTURE) ×2 IMPLANT
SUT MNCRL 3 0 VIOLET RB1 (SUTURE) ×2 IMPLANT
SUT MNCRL AB 4-0 PS2 18 (SUTURE) ×6 IMPLANT
SUT MONOCRYL 3 0 RB1 (SUTURE) ×6
SUT VIC AB 0 CT1 27 (SUTURE) ×3
SUT VIC AB 0 CT1 27XBRD ANTBC (SUTURE) ×2 IMPLANT
SUT VIC AB 0 UR5 27 (SUTURE) ×3 IMPLANT
SUT VIC AB 2-0 CT1 27 (SUTURE) ×3
SUT VIC AB 2-0 CT1 27XBRD (SUTURE) ×1 IMPLANT
SUT VIC AB 2-0 SH 27 (SUTURE) ×3
SUT VIC AB 2-0 SH 27X BRD (SUTURE) ×2 IMPLANT
SUT VIC AB 3-0 SH 27 (SUTURE) ×3
SUT VIC AB 3-0 SH 27XBRD (SUTURE) ×2 IMPLANT
SUT VICRYL 0 UR6 27IN ABS (SUTURE) ×6 IMPLANT
SYR 27GX1/2 1ML LL SAFETY (SYRINGE) ×3 IMPLANT
TOWEL OR NON WOVEN STRL DISP B (DISPOSABLE) ×3 IMPLANT
TROCAR XCEL NON-BLD 5MMX100MML (ENDOMECHANICALS) IMPLANT
WATER STERILE IRR 1000ML POUR (IV SOLUTION) ×3 IMPLANT

## 2021-03-19 NOTE — Interval H&P Note (Signed)
History and Physical Interval Note:  03/19/2021 5:20 AM  Joe Jones  has presented today for surgery, with the diagnosis of PROSTATE CANCER.  The various methods of treatment have been discussed with the patient and family. After consideration of risks, benefits and other options for treatment, the patient has consented to  Procedure(s) with comments: XI ROBOTIC ASSISTED LAPAROSCOPIC RADICAL PROSTATECTOMY LEVEL 3 (N/A) - ONLY NEEDS 210 MIN LYMPHADENECTOMY, PELVIC (Bilateral) as a surgical intervention.  The patient's history has been reviewed, patient examined, no change in status, stable for surgery.  I have reviewed the patient's chart and labs.  Questions were answered to the patient's satisfaction.     [employer]

## 2021-03-19 NOTE — Op Note (Signed)
Preoperative diagnosis: Clinically localized adenocarcinoma of the prostate (clinical stage T1c Nx Mx)  Postoperative diagnosis: Clinically localized adenocarcinoma of the prostate (clinical stage T1c Nx Mx)  Procedure:  1. Robotic assisted laparoscopic radical prostatectomy (bilateral nerve sparing) 2. Bilateral robotic assisted laparoscopic pelvic lymphadenectomy  Surgeon: Pryor Curia. M.D.  Assistant:  Debbrah Alar, PA-C  An assistant was required for this surgical procedure.  The duties of the assistant included but were not limited to suctioning, passing suture, camera manipulation, retraction. This procedure would not be able to be performed without an Environmental consultant.  Resident: Dr. Celene Squibb  Anesthesia: General  Complications: None  EBL: 300 mL  IVF:  2000 mL crystalloid  Specimens: 1. Prostate and seminal vesicles 2. Right pelvic lymph nodes 3. Left pelvic lymph nodes 4. Right posterolateral margin  Disposition of specimens: Pathology  Drains: 1. 20 Fr coude catheter 2. # 19 Blake pelvic drain  Indication: Joe Jones is a 70 y.o. year old patient with clinically localized prostate cancer.  After a thorough review of the management options for treatment of prostate cancer, he elected to proceed with surgical therapy and the above procedure(s).  We have discussed the potential benefits and risks of the procedure, side effects of the proposed treatment, the likelihood of the patient achieving the goals of the procedure, and any potential problems that might occur during the procedure or recuperation. Informed consent has been obtained.  Description of procedure:  The patient was taken to the operating room and a general anesthetic was administered. He was given preoperative antibiotics, placed in the dorsal lithotomy position, and prepped and draped in the usual sterile fashion. Next a preoperative timeout was performed. A urethral catheter was placed  into the bladder and a site was selected near the umbilicus for placement of the camera port. This was placed using a standard open Hassan technique which allowed entry into the peritoneal cavity under direct vision and without difficulty. An 8 mm robotic port was placed and a pneumoperitoneum established. The camera was then used to inspect the abdomen and there was no evidence of any intra-abdominal injuries or other abnormalities. The remaining abdominal ports were then placed. 8 mm robotic ports were placed in the right lower quadrant, left lower quadrant, and far left lateral abdominal wall. A 5 mm port was placed in the right upper quadrant and a 12 mm port was placed in the right lateral abdominal wall for laparoscopic assistance. All ports were placed under direct vision without difficulty. The surgical cart was then docked.   Utilizing the cautery scissors, the bladder was reflected posteriorly allowing entry into the space of Retzius and identification of the endopelvic fascia and prostate. The periprostatic fat was then removed from the prostate allowing full exposure of the endopelvic fascia. The endopelvic fascia was then incised from the apex back to the base of the prostate bilaterally and the underlying levator muscle fibers were swept laterally off the prostate thereby isolating the dorsal venous complex. The dorsal vein was then stapled and divided with a 45 mm Flex Echelon stapler. Attention then turned to the bladder neck which was divided anteriorly thereby allowing entry into the bladder and exposure of the urethral catheter. The catheter balloon was deflated and the catheter was brought into the operative field and used to retract the prostate anteriorly.  The prior TUR defect was noted with some adenomatous growth noted particularly on the right.  Indigo carmine was administered and the ureteral orifices were identified.  The posterior bladder neck was then examined and was divided allowing  further dissection between the bladder and prostate posteriorly until the vasa deferentia and seminal vessels were identified. The vasa deferentia were isolated, divided, and lifted anteriorly. The seminal vesicles were dissected down to their tips with care to control the seminal vascular arterial blood supply. These structures were then lifted anteriorly and the space between Denonvillier's fascia and the anterior rectum was developed with a combination of sharp and blunt dissection. This isolated the vascular pedicles of the prostate.  The lateral prostatic fascia was then sharply incised allowing release of the neurovascular bundles bilaterally. The vascular pedicles of the prostate were then ligated with Weck clips between the prostate and neurovascular bundles and divided with sharp cold scissor dissection resulting in neurovascular bundle preservation. The neurovascular bundles were then separated off the apex of the prostate and urethra bilaterally.  There was some suspicious appearing tissue toward the right posterolateral margin and this was sampled as a separate margin specimen.  The urethra was then sharply transected allowing the prostate specimen to be disarticulated. The pelvis was copiously irrigated and hemostasis was ensured. There was no evidence for rectal injury.  Attention then turned to the right pelvic sidewall. The fibrofatty tissue between the external iliac vein, confluence of the iliac vessels, hypogastric artery, and Cooper's ligament was dissected free from the pelvic sidewall with care to preserve the obturator nerve. Weck clips were used for lymphostasis and hemostasis. An identical procedure was performed on the contralateral side and the lymphatic packets were removed for permanent pathologic analysis.  Attention then turned to the urethral anastomosis. A 2-0 Vicryl slip knot was placed between Denonvillier's fascia, the posterior bladder neck, and the posterior urethra to  reapproximate these structures. A double-armed 3-0 Monocryl suture was then used to perform a 360 running tension-free anastomosis between the bladder neck and urethra. A new urethral catheter was then placed into the bladder and irrigated. There were no blood clots within the bladder and the anastomosis appeared to be watertight. A #19 Blake drain was then brought through the left lateral 8 mm port site and positioned appropriately within the pelvis. It was secured to the skin with a nylon suture. The surgical cart was then undocked. The right lateral 12 mm port site was closed at the fascial level with a 0 Vicryl suture placed laparoscopically. All remaining ports were then removed under direct vision. The prostate specimen was removed intact within the Endopouch retrieval bag via the periumbilical camera port site. This fascial opening was closed with two running 0 Vicryl sutures. 0.25% Marcaine was then injected into all port sites and all incisions were reapproximated at the skin level with 4-0 Monocryl subcuticular sutures and Dermabond. The patient appeared to tolerate the procedure well and without complications. The patient was able to be extubated and transferred to the recovery unit in satisfactory condition.   Pryor Curia MD

## 2021-03-19 NOTE — Progress Notes (Signed)
Patient ID: DEMARUS LATTERELL, male   DOB: 08-26-1951, 70 y.o.   MRN: [id number] Post-op note  Subjective: The patient is doing well.  No complaints except bladder spasms. Has not ambulated yet.  Objective: Vital signs in last 24 hours: Temp:  [97 F (36.1 C)-98.1 F (36.7 C)] 97.6 F (36.4 C) (04/14 1315) Pulse Rate:  [80-91] 87 (04/14 1315) Resp:  [10-24] 18 (04/14 1317) BP: (121-154)/(61-90) 143/70 (04/14 1315) SpO2:  [93 %-100 %] 93 % (04/14 1315) Weight:  [108 kg-111.4 kg] 111.4 kg (04/14 1315)  Intake/Output from previous day: No intake/output data recorded. Intake/Output this shift: Total I/O In: 2100 [I.V.:2000; IV Piggyback:100] Out: 300 [Blood:300]  Physical Exam:  General: Alert and oriented. Abdomen: Soft, Nondistended. Incisions: Clean and dry. Urine: blue  Lab Results: Recent Labs    03/19/21 1119  HGB 13.2  HCT 38.3*    Assessment/Plan: POD#0   1) Continue to monitor  2) DVT prophy, clears, IS, amb, pain control  3) B/O supp for spasms   LOS: 0 days   Debbrah Alar 03/19/2021, 3:10 PM

## 2021-03-19 NOTE — Transfer of Care (Signed)
Immediate Anesthesia Transfer of Care Note  Patient: Joe Jones  Procedure(s) Performed: Procedure(s) with comments: XI ROBOTIC ASSISTED LAPAROSCOPIC RADICAL PROSTATECTOMY LEVEL 3 (N/A) - ONLY NEEDS 210 MIN LYMPHADENECTOMY, PELVIC (Bilateral)  Patient Location: PACU  Anesthesia Type:General  Level of Consciousness: Alert, Awake, Oriented  Airway & Oxygen Therapy: Patient Spontanous Breathing  Post-op Assessment: Report given to RN  Post vital signs: Reviewed and stable  Last Vitals:  Vitals:   03/19/21 0520  BP: (!) 154/80  Pulse: 80  Resp: 17  Temp: 36.7 C  SpO2: 03%    Complications: No apparent anesthesia complications

## 2021-03-19 NOTE — Plan of Care (Signed)
  Problem: Education: Goal: Required Educational Video(s) Outcome: Progressing   Problem: Clinical Measurements: Goal: Ability to maintain clinical measurements within normal limits will improve Outcome: Progressing Goal: Postoperative complications will be avoided or minimized Outcome: Progressing   Problem: Skin Integrity: Goal: Demonstration of wound healing without infection will improve Outcome: Progressing   

## 2021-03-19 NOTE — Anesthesia Postprocedure Evaluation (Signed)
Anesthesia Post Note  Patient: Joe Jones  Procedure(s) Performed: XI ROBOTIC ASSISTED LAPAROSCOPIC RADICAL PROSTATECTOMY LEVEL 3 (N/A ) LYMPHADENECTOMY, PELVIC (Bilateral )     Patient location during evaluation: PACU Anesthesia Type: General Level of consciousness: awake Pain management: pain level controlled Vital Signs Assessment: post-procedure vital signs reviewed and stable Respiratory status: spontaneous breathing, nonlabored ventilation, respiratory function stable and patient connected to nasal cannula oxygen Cardiovascular status: blood pressure returned to baseline and stable Postop Assessment: no apparent nausea or vomiting Anesthetic complications: no   No complications documented.  Last Vitals:  Vitals:   03/19/21 1315 03/19/21 1317  BP: (!) 143/70   Pulse: 87   Resp: 10 18  Temp: 36.4 C   SpO2: 93%     Last Pain:  Vitals:   03/19/21 1400  TempSrc:   PainSc: 0-No pain                 Hassan Blackshire P Marston Mccadden

## 2021-03-19 NOTE — Discharge Instructions (Signed)

## 2021-03-19 NOTE — Anesthesia Procedure Notes (Signed)
Procedure Name: Intubation Date/Time: 03/19/2021 7:28 AM Performed by: Gerald Leitz, CRNA Pre-anesthesia Checklist: Patient identified, Patient being monitored, Timeout performed, Emergency Drugs available and Suction available Patient Re-evaluated:Patient Re-evaluated prior to induction Oxygen Delivery Method: Circle system utilized Preoxygenation: Pre-oxygenation with 100% oxygen Induction Type: IV induction Ventilation: Mask ventilation without difficulty Laryngoscope Size: Mac and 3 Grade View: Grade I Tube type: Oral Tube size: 8.0 mm Number of attempts: 1 Placement Confirmation: ETT inserted through vocal cords under direct vision,  positive ETCO2 and breath sounds checked- equal and bilateral Secured at: 22 cm Tube secured with: Tape Dental Injury: Teeth and Oropharynx as per pre-operative assessment

## 2021-03-20 ENCOUNTER — Encounter (HOSPITAL_COMMUNITY): Payer: Self-pay | Admitting: Urology

## 2021-03-20 DIAGNOSIS — C61 Malignant neoplasm of prostate: Secondary | ICD-10-CM | POA: Diagnosis not present

## 2021-03-20 LAB — HEMOGLOBIN AND HEMATOCRIT, BLOOD
HCT: 36 % — ABNORMAL LOW (ref 39.0–52.0)
Hemoglobin: 12.3 g/dL — ABNORMAL LOW (ref 13.0–17.0)

## 2021-03-20 MED ORDER — TRAMADOL HCL 50 MG PO TABS
50.0000 mg | ORAL_TABLET | Freq: Four times a day (QID) | ORAL | Status: DC | PRN
Start: 2021-03-20 — End: 2021-03-20

## 2021-03-20 MED ORDER — BISACODYL 10 MG RE SUPP
10.0000 mg | Freq: Once | RECTAL | Status: AC
  Administered 2021-03-20: 10 mg via RECTAL
  Filled 2021-03-20: qty 1

## 2021-03-20 NOTE — Progress Notes (Signed)
Patient ID: Joe Jones, male   DOB: 1951-07-11, 70 y.o.   MRN: [id number]  1 Day Post-Op Subjective: The patient is doing well.  No nausea or vomiting. Pain is adequately controlled.  Objective: Vital signs in last 24 hours: Temp:  [97 F (36.1 C)-98.1 F (36.7 C)] 97.5 F (36.4 C) (04/15 0546) Pulse Rate:  [69-91] 71 (04/15 0546) Resp:  [10-24] 18 (04/15 0546) BP: (120-152)/(61-90) 147/76 (04/15 0546) SpO2:  [93 %-100 %] 98 % (04/15 0546) Weight:  [111.4 kg] 111.4 kg (04/14 1315)  Intake/Output from previous day: 04/14 0701 - 04/15 0700 In: 3707 [P.O.:720; I.V.:2887; IV Piggyback:100] Out: 3508 [BLTGA:8902; Drains:83; Blood:300] Intake/Output this shift: No intake/output data recorded.  Physical Exam:  General: Alert and oriented. CV: RRR Lungs: Clear bilaterally. GI: Soft, Nondistended. Incisions: Clean, dry, and intact Urine: Clear Extremities: Nontender, no erythema, no edema.  Lab Results: Recent Labs    03/19/21 1119 03/20/21 0345  HGB 13.2 12.3*  HCT 38.3* 36.0*      Assessment/Plan: POD# 1 s/p robotic prostatectomy.  1) SL IVF 2) Ambulate, Incentive spirometry 3) Transition to oral pain medication 4) Dulcolax suppository 5) D/C pelvic drain 6) Plan for likely discharge later today   Pryor Curia. MD   LOS: 0 days   Dutch Gray 03/20/2021, 7:44 AM

## 2021-03-20 NOTE — Progress Notes (Signed)
Foley care and leg bag teaching done with pt and wife at bedside. They both feel comfortable managing Foley and leg bag at home.

## 2021-03-20 NOTE — Discharge Summary (Signed)
  Date of admission: 03/19/2021  Date of discharge: 03/20/2021  Admission diagnosis: Prostate Cancer  Discharge diagnosis: Prostate Cancer  History and Physical: For full details, please see admission history and physical. Briefly, Joe Jones is a 70 y.o. gentleman with localized prostate cancer.  After discussing management/treatment options, he elected to proceed with surgical treatment.  Hospital Course: Joe Jones was taken to the operating room on 03/19/2021 and underwent a robotic assisted laparoscopic radical prostatectomy. He tolerated this procedure well and without complications. Postoperatively, he was able to be transferred to a regular hospital room following recovery from anesthesia.  He was able to begin ambulating the night of surgery. He remained hemodynamically stable overnight.  He had excellent urine output with appropriately minimal output from his pelvic drain and his pelvic drain was removed on POD #1.  He was transitioned to oral pain medication, tolerated a clear liquid diet, and had met all discharge criteria and was able to be discharged home later on POD#1.  Laboratory values: Recent Labs    03/19/21 1119 03/20/21 0345  HGB 13.2 12.3*  HCT 38.3* 36.0*    Disposition: Home  Discharge instruction: He was instructed to be ambulatory but to refrain from heavy lifting, strenuous activity, or driving. He was instructed on urethral catheter care.  Discharge medications:   Allergies as of 03/20/2021   No Known Allergies     Medication List    STOP taking these medications   vitamin B-12 1000 MCG tablet Commonly known as: CYANOCOBALAMIN     TAKE these medications   amLODipine 10 MG tablet Commonly known as: NORVASC Take 10 mg by mouth daily.   docusate sodium 100 MG capsule Commonly known as: COLACE Take 1 capsule (100 mg total) by mouth 2 (two) times daily.   hydrochlorothiazide 25 MG tablet Commonly known as: HYDRODIURIL Take 25 mg by mouth  every morning.   lisinopril 10 MG tablet Commonly known as: ZESTRIL Take 20 mg by mouth daily.   rosuvastatin 10 MG tablet Commonly known as: CRESTOR Take 10 mg by mouth at bedtime.   sulfamethoxazole-trimethoprim 800-160 MG tablet Commonly known as: BACTRIM DS Take 1 tablet by mouth 2 (two) times daily. Start the day prior to foley removal appointment   traMADol 50 MG tablet Commonly known as: Ultram Take 1-2 tablets (50-100 mg total) by mouth every 6 (six) hours as needed for moderate pain or severe pain.       Followup: He will followup in 1 week for catheter removal and to discuss his surgical pathology results.

## 2021-03-24 LAB — SURGICAL PATHOLOGY

## 2021-04-07 DIAGNOSIS — C61 Malignant neoplasm of prostate: Secondary | ICD-10-CM | POA: Diagnosis not present

## 2021-04-16 DIAGNOSIS — M6281 Muscle weakness (generalized): Secondary | ICD-10-CM | POA: Diagnosis not present

## 2021-04-16 DIAGNOSIS — M62838 Other muscle spasm: Secondary | ICD-10-CM | POA: Diagnosis not present

## 2021-04-16 DIAGNOSIS — N393 Stress incontinence (female) (male): Secondary | ICD-10-CM | POA: Diagnosis not present

## 2021-07-03 DIAGNOSIS — Z6830 Body mass index (BMI) 30.0-30.9, adult: Secondary | ICD-10-CM | POA: Diagnosis not present

## 2021-07-03 DIAGNOSIS — I10 Essential (primary) hypertension: Secondary | ICD-10-CM | POA: Diagnosis not present

## 2021-07-03 DIAGNOSIS — Z8546 Personal history of malignant neoplasm of prostate: Secondary | ICD-10-CM | POA: Diagnosis not present

## 2021-07-03 DIAGNOSIS — E7800 Pure hypercholesterolemia, unspecified: Secondary | ICD-10-CM | POA: Diagnosis not present

## 2021-08-05 DIAGNOSIS — C61 Malignant neoplasm of prostate: Secondary | ICD-10-CM | POA: Diagnosis not present

## 2021-08-12 DIAGNOSIS — K432 Incisional hernia without obstruction or gangrene: Secondary | ICD-10-CM | POA: Diagnosis not present

## 2021-08-12 DIAGNOSIS — C61 Malignant neoplasm of prostate: Secondary | ICD-10-CM | POA: Diagnosis not present

## 2021-08-12 DIAGNOSIS — N5201 Erectile dysfunction due to arterial insufficiency: Secondary | ICD-10-CM | POA: Diagnosis not present

## 2021-08-12 DIAGNOSIS — N393 Stress incontinence (female) (male): Secondary | ICD-10-CM | POA: Diagnosis not present

## 2021-10-12 DIAGNOSIS — I700 Atherosclerosis of aorta: Secondary | ICD-10-CM | POA: Diagnosis not present

## 2021-10-12 DIAGNOSIS — Z6830 Body mass index (BMI) 30.0-30.9, adult: Secondary | ICD-10-CM | POA: Diagnosis not present

## 2021-10-12 DIAGNOSIS — I10 Essential (primary) hypertension: Secondary | ICD-10-CM | POA: Diagnosis not present

## 2021-10-12 DIAGNOSIS — Z1331 Encounter for screening for depression: Secondary | ICD-10-CM | POA: Diagnosis not present

## 2021-10-12 DIAGNOSIS — Z8546 Personal history of malignant neoplasm of prostate: Secondary | ICD-10-CM | POA: Diagnosis not present

## 2021-10-12 DIAGNOSIS — K432 Incisional hernia without obstruction or gangrene: Secondary | ICD-10-CM | POA: Diagnosis not present

## 2021-10-12 DIAGNOSIS — E7800 Pure hypercholesterolemia, unspecified: Secondary | ICD-10-CM | POA: Diagnosis not present

## 2021-10-12 DIAGNOSIS — Z79899 Other long term (current) drug therapy: Secondary | ICD-10-CM | POA: Diagnosis not present

## 2021-10-12 DIAGNOSIS — Z0000 Encounter for general adult medical examination without abnormal findings: Secondary | ICD-10-CM | POA: Diagnosis not present

## 2021-10-20 DIAGNOSIS — K432 Incisional hernia without obstruction or gangrene: Secondary | ICD-10-CM | POA: Diagnosis not present

## 2021-11-12 DIAGNOSIS — Z87891 Personal history of nicotine dependence: Secondary | ICD-10-CM | POA: Diagnosis not present

## 2021-11-12 DIAGNOSIS — K432 Incisional hernia without obstruction or gangrene: Secondary | ICD-10-CM | POA: Diagnosis not present

## 2021-11-12 DIAGNOSIS — Z5331 Laparoscopic surgical procedure converted to open procedure: Secondary | ICD-10-CM | POA: Diagnosis not present

## 2022-03-17 DIAGNOSIS — C61 Malignant neoplasm of prostate: Secondary | ICD-10-CM | POA: Diagnosis not present

## 2022-03-24 DIAGNOSIS — N5201 Erectile dysfunction due to arterial insufficiency: Secondary | ICD-10-CM | POA: Diagnosis not present

## 2022-03-24 DIAGNOSIS — C61 Malignant neoplasm of prostate: Secondary | ICD-10-CM | POA: Diagnosis not present

## 2022-03-24 DIAGNOSIS — N393 Stress incontinence (female) (male): Secondary | ICD-10-CM | POA: Diagnosis not present

## 2022-04-06 DIAGNOSIS — Z6830 Body mass index (BMI) 30.0-30.9, adult: Secondary | ICD-10-CM | POA: Diagnosis not present

## 2022-04-06 DIAGNOSIS — I10 Essential (primary) hypertension: Secondary | ICD-10-CM | POA: Diagnosis not present

## 2022-04-06 DIAGNOSIS — E7800 Pure hypercholesterolemia, unspecified: Secondary | ICD-10-CM | POA: Diagnosis not present

## 2022-04-06 DIAGNOSIS — N529 Male erectile dysfunction, unspecified: Secondary | ICD-10-CM | POA: Diagnosis not present

## 2022-08-02 IMAGING — CT NM PET SKULL BASE TO THIGH
7 series · 25 of 25 positions shown · non-contrast
Comparison: None.

CLINICAL DATA: Prostate carcinoma with biochemical recurrence.

EXAM:
NUCLEAR MEDICINE PET SKULL BASE TO THIGH
TECHNIQUE: 10.0 mCi F18 Axumin (Fluciclovine) was injected intravenously.
Full-ring PET imaging was performed from the skull base to thigh
after the radiotracer. CT data was obtained and used for attenuation
correction and anatomic localization.

[Series 3: pet sk_thigh ac · axial · 5.0mm · 4.07mm/px · z∈[+380,+1316]mm · 6 of 235 slices shown]
[im 1/235]
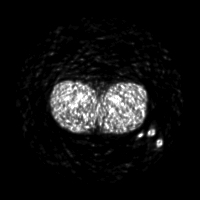
[im 47/235]
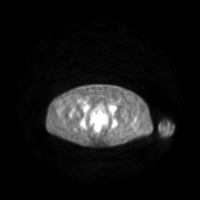
[im 94/235]
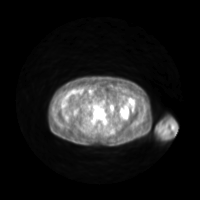
[im 141/235]
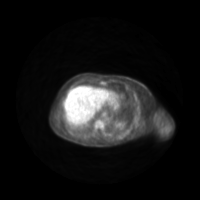
[im 188/235]
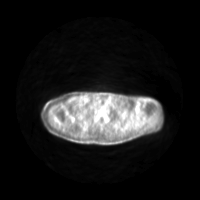
[im 235/235]
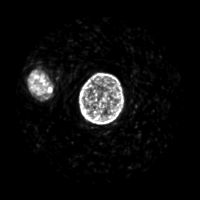

[Series 4: ct sk_thigh 5.0 hd_fov · axial · 5.0mm · 1.52mm/px · z∈[+380,+1316]mm · 5 of 235 slices shown]
[im 1/235]
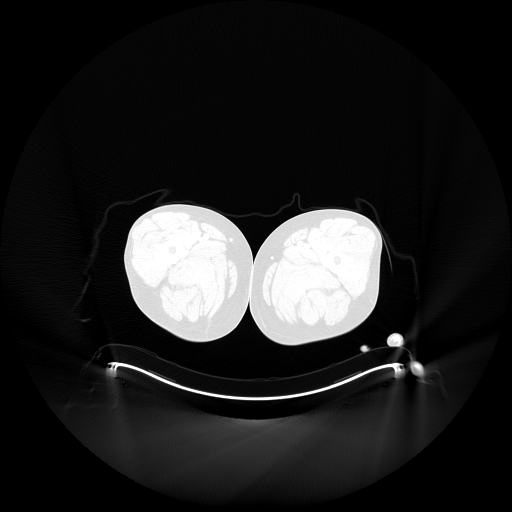
[im 59/235]
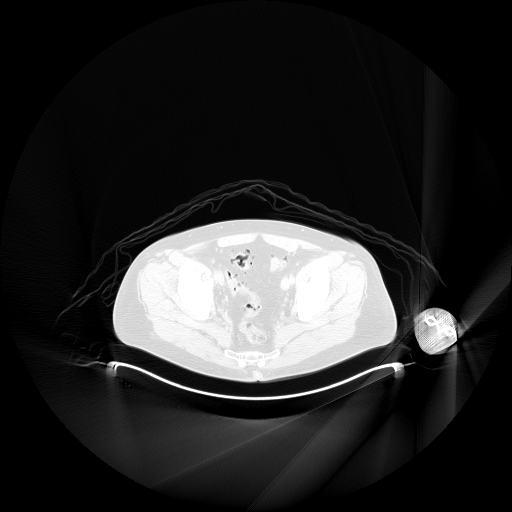
[im 118/235]
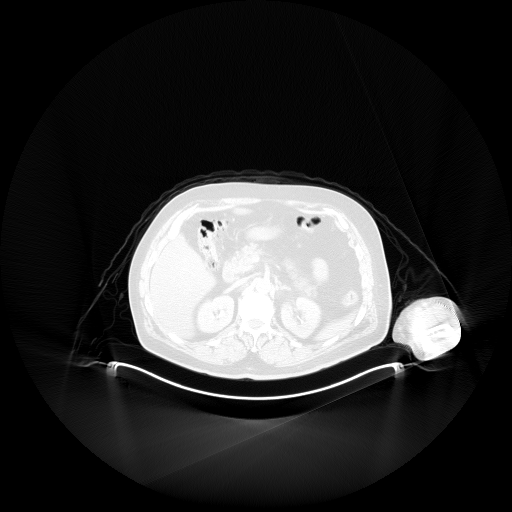
[im 176/235]
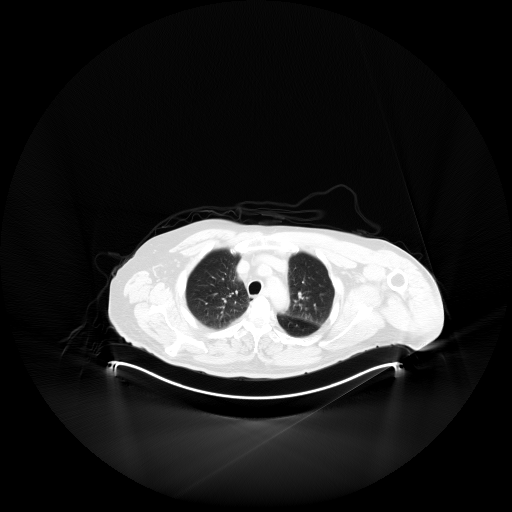
[im 235/235]
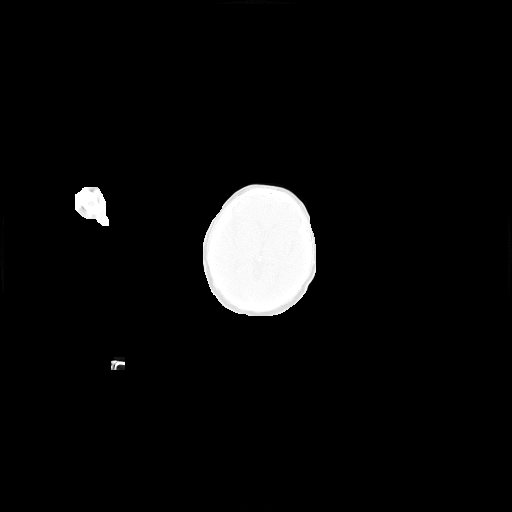

[Series 5: pet sk_thigh nac · axial · 5.0mm · 4.07mm/px · z∈[+380,+1316]mm · 5 of 235 slices shown]
[im 1/235]
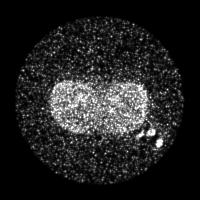
[im 59/235]
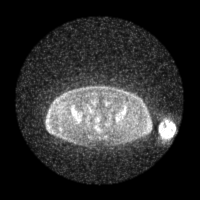
[im 118/235]
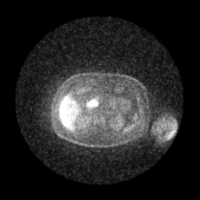
[im 176/235]
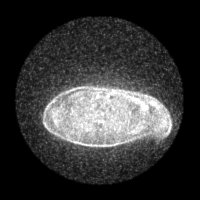
[im 235/235]
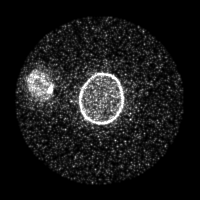

[Series 8: ct sk_thigh 5.0 (id) lung_bone · axial · 5.0mm · 0.73mm/px · z∈[+890,+1158]mm · 2 of 68 slices shown]
[im 1/68]
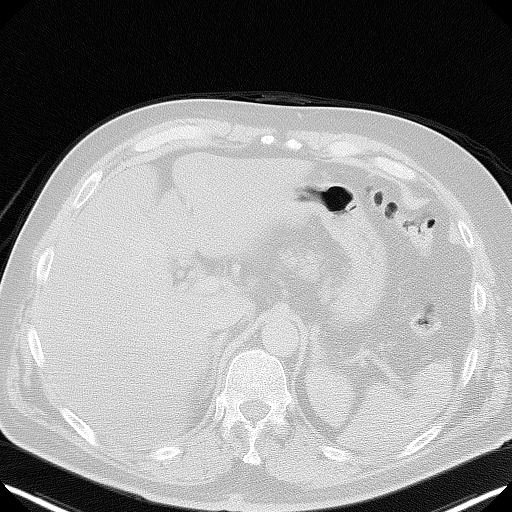
[im 68/68]
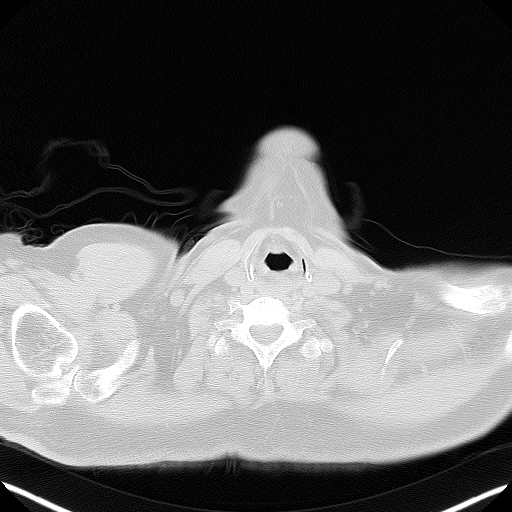

[Series 603: fused cor · 1 of 43 slices shown]
[im 1/43]
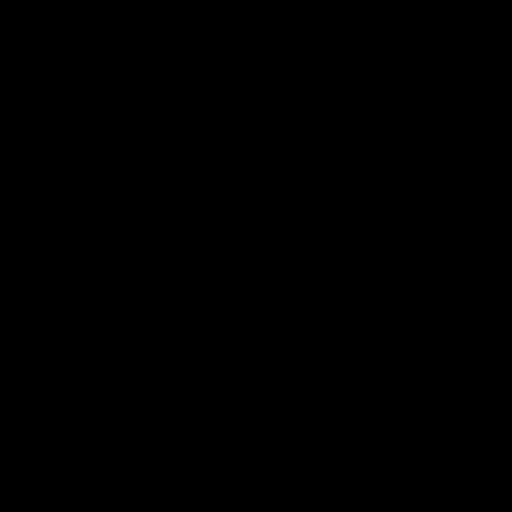

[Series 604: <mip collection> · coronal · 1.94mm/px · 1 of 32 slices shown]
[im 1/32]
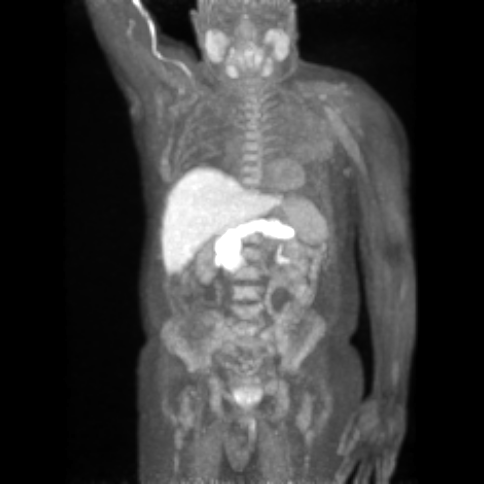

[Series 605: range-ct sk_thigh 5.0 hd_fov-tra-<alpha range> · 5 of 226 slices shown]
[im 1/226]
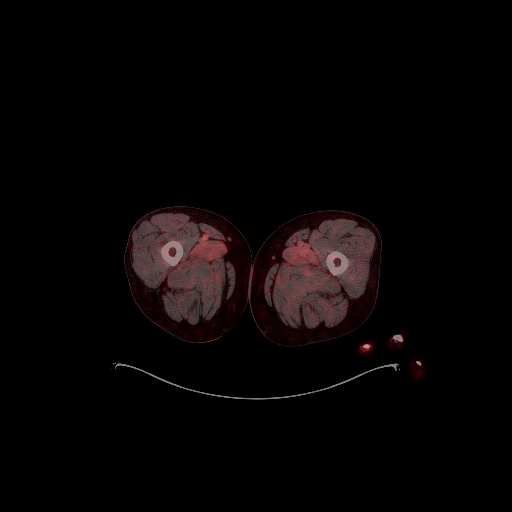
[im 57/226]
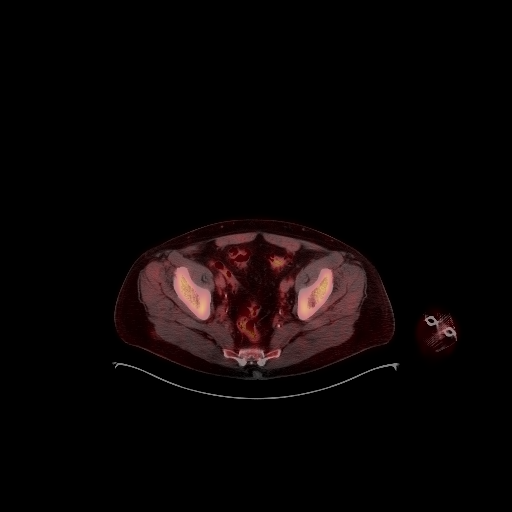
[im 113/226]
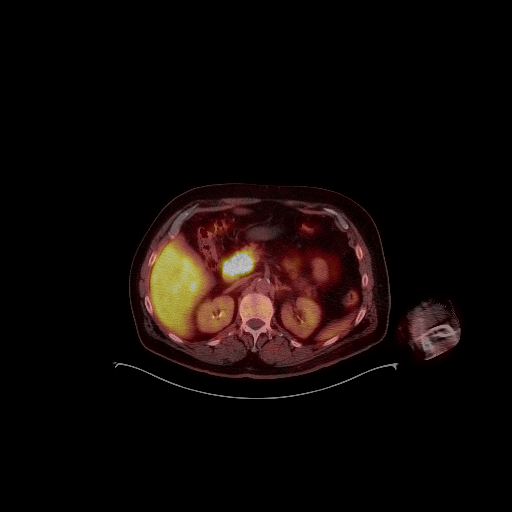
[im 169/226]
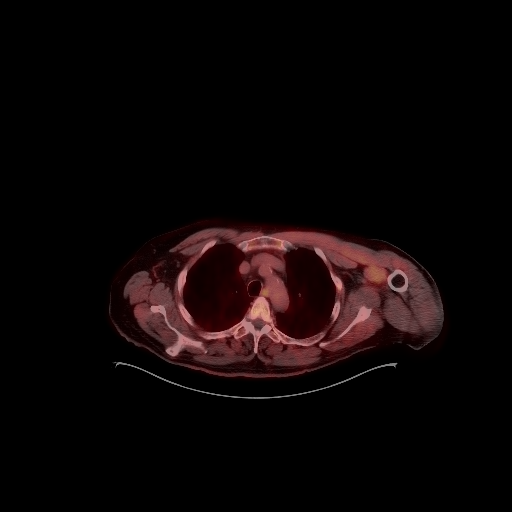
[im 226/226]
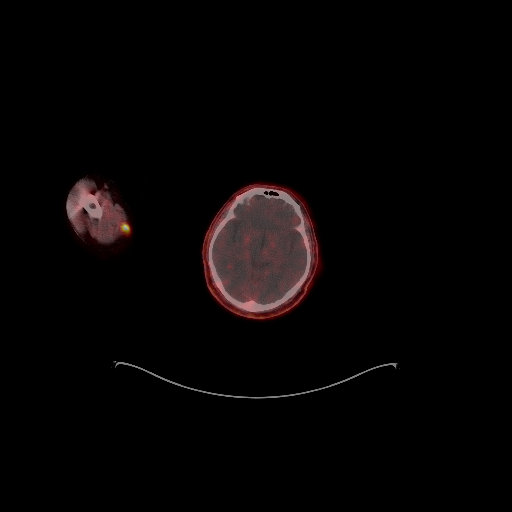

[25 of 25 positions shown; findings below may reference images not displayed]

FINDINGS: NECK

No radiotracer activity in neck lymph nodes.

Incidental CT finding: None

CHEST

No radiotracer accumulation within mediastinal or hilar lymph nodes.
No suspicious pulmonary nodules on the CT scan.

Incidental CT finding: None

ABDOMEN/PELVIS

Prostate: Intense activity within the prostate gland. Activity
involves the entirety of the RIGHT peripheral zone and the posterior
aspect of the LEFT peripheral zone. Activity is relatively intense
with SUV max equal 8.4.

Lymph nodes: Low level activity associated with a distal LEFT
external iliac lymph node measuring 5 mm (182/4). Small more central
LEFT external iliac lymph node measuring 5 mm on image 175/4 without
significant radiotracer activity.

No abnormal radiotracer accumulation or pathologically enlarged
lymph nodes in the periaortic retroperitoneum.

Liver: No evidence of liver metastasis

Incidental CT finding: Diffuse low-attenuation liver consistent
hepatic steatosis.

SKELETON

A diffuse activity within the marrow space is favored benign.
IMPRESSION: 1. Intense uptake within the prostate gland could represent prostate
adenocarcinoma.
2. No evidence of metastatic adenopathy within the pelvis or
periaortic retroperitoneum.
3. No evidence of visceral metastasis or skeletal metastasis.
4. Hepatic steatosis.

## 2022-10-11 DIAGNOSIS — N529 Male erectile dysfunction, unspecified: Secondary | ICD-10-CM | POA: Diagnosis not present

## 2022-10-11 DIAGNOSIS — C61 Malignant neoplasm of prostate: Secondary | ICD-10-CM | POA: Diagnosis not present

## 2022-10-13 DIAGNOSIS — K432 Incisional hernia without obstruction or gangrene: Secondary | ICD-10-CM | POA: Diagnosis not present

## 2022-10-15 DIAGNOSIS — K760 Fatty (change of) liver, not elsewhere classified: Secondary | ICD-10-CM | POA: Diagnosis not present

## 2022-10-15 DIAGNOSIS — K5730 Diverticulosis of large intestine without perforation or abscess without bleeding: Secondary | ICD-10-CM | POA: Diagnosis not present

## 2022-10-15 DIAGNOSIS — K432 Incisional hernia without obstruction or gangrene: Secondary | ICD-10-CM | POA: Diagnosis not present

## 2022-10-18 DIAGNOSIS — Z6831 Body mass index (BMI) 31.0-31.9, adult: Secondary | ICD-10-CM | POA: Diagnosis not present

## 2022-10-18 DIAGNOSIS — Z0000 Encounter for general adult medical examination without abnormal findings: Secondary | ICD-10-CM | POA: Diagnosis not present

## 2022-10-18 DIAGNOSIS — M1990 Unspecified osteoarthritis, unspecified site: Secondary | ICD-10-CM | POA: Diagnosis not present

## 2022-10-18 DIAGNOSIS — Z91199 Patient's noncompliance with other medical treatment and regimen due to unspecified reason: Secondary | ICD-10-CM | POA: Diagnosis not present

## 2022-10-18 DIAGNOSIS — Z2821 Immunization not carried out because of patient refusal: Secondary | ICD-10-CM | POA: Diagnosis not present

## 2022-10-18 DIAGNOSIS — Z79899 Other long term (current) drug therapy: Secondary | ICD-10-CM | POA: Diagnosis not present

## 2022-10-18 DIAGNOSIS — Z8546 Personal history of malignant neoplasm of prostate: Secondary | ICD-10-CM | POA: Diagnosis not present

## 2022-10-18 DIAGNOSIS — Z1331 Encounter for screening for depression: Secondary | ICD-10-CM | POA: Diagnosis not present

## 2022-10-18 DIAGNOSIS — E7800 Pure hypercholesterolemia, unspecified: Secondary | ICD-10-CM | POA: Diagnosis not present

## 2022-10-22 DIAGNOSIS — K432 Incisional hernia without obstruction or gangrene: Secondary | ICD-10-CM | POA: Diagnosis not present

## 2022-11-01 DIAGNOSIS — Z1211 Encounter for screening for malignant neoplasm of colon: Secondary | ICD-10-CM | POA: Diagnosis not present

## 2022-11-01 DIAGNOSIS — Z1212 Encounter for screening for malignant neoplasm of rectum: Secondary | ICD-10-CM | POA: Diagnosis not present

## 2022-11-17 DIAGNOSIS — K432 Incisional hernia without obstruction or gangrene: Secondary | ICD-10-CM | POA: Diagnosis not present

## 2022-12-07 ENCOUNTER — Ambulatory Visit: Payer: Self-pay | Admitting: Surgery

## 2022-12-07 NOTE — Progress Notes (Signed)
Sent message, via epic in basket, requesting orders in epic from surgeon.  

## 2022-12-10 NOTE — Progress Notes (Signed)
COVID Vaccine Completed:  Date of COVID positive in last 90 days:  PCP - Lovette Cliche, MD Cardiologist -   Chest x-ray -  EKG -  Stress Test -  ECHO -  Cardiac Cath -  Pacemaker/ICD device last checked: Spinal Cord Stimulator:  Bowel Prep -   Sleep Study -  CPAP -   Fasting Blood Sugar -  Checks Blood Sugar _____ times a day  Last dose of GLP1 agonist-  N/A GLP1 instructions:  N/A   Last dose of SGLT-2 inhibitors-  N/A SGLT-2 instructions: N/A   Blood Thinner Instructions: Aspirin Instructions: Last Dose:  Activity level:  Can go up a flight of stairs and perform activities of daily living without stopping and without symptoms of chest pain or shortness of breath.  Able to exercise without symptoms  Unable to go up a flight of stairs without symptoms of     Anesthesia review:   Patient denies shortness of breath, fever, cough and chest pain at PAT appointment  Patient verbalized understanding of instructions that were given to them at the PAT appointment. Patient was also instructed that they will need to review over the PAT instructions again at home before surgery.

## 2022-12-10 NOTE — Patient Instructions (Signed)
SURGICAL WAITING ROOM VISITATION  Patients having surgery or a procedure may have no more than 2 support people in the waiting area - these visitors may rotate.    Children under the age of 26 must have an adult with them who is not the patient.  Due to an increase in RSV and influenza rates and associated hospitalizations, children ages 46 and under may not visit patients in [place].  If the patient needs to stay at the hospital during part of their recovery, the visitor guidelines for inpatient rooms apply. Pre-op nurse will coordinate an appropriate time for 1 support person to accompany patient in pre-op.  This support person may not rotate.    Please refer to the [facility] website for the visitor guidelines for Inpatients (after your surgery is over and you are in a regular room).     Your procedure is scheduled on: 12/27/22   Report to [facility] Main Entrance    Report to admitting at 11:45 AM   Call this number if you have problems the morning of surgery [phone]   Do not eat food :After Midnight.   After Midnight you may have the following liquids until 11:00 AM DAY OF SURGERY  Water Non-Citrus Juices (without pulp, NO RED-Apple, White grape, White cranberry) Black Coffee (NO MILK/CREAM OR CREAMERS, sugar ok)  Clear Tea (NO MILK/CREAM OR CREAMERS, sugar ok) regular and decaf                             Plain Jell-O (NO RED)                                           Fruit ices (not with fruit pulp, NO RED)                                     Popsicles (NO RED)                                                               Sports drinks like Gatorade (NO RED)                      If you have questions, please contact your surgeon's office.   FOLLOW BOWEL PREP AND ANY ADDITIONAL PRE OP INSTRUCTIONS YOU RECEIVED FROM YOUR SURGEON'S OFFICE!!!     Oral Hygiene is also important to reduce your risk of infection.                                     Remember - BRUSH YOUR TEETH THE MORNING OF SURGERY WITH YOUR REGULAR TOOTHPASTE  DENTURES WILL BE REMOVED PRIOR TO SURGERY PLEASE DO NOT APPLY "Poly grip" OR ADHESIVES!!!   Take these medicines the morning of surgery with A SIP OF WATER: Amlodipine  You may not have any metal on your body including jewelry, and body piercing             Do not wear lotions, powders, cologne, or deodorant              Men may shave face and neck.   Do not bring valuables to the hospital. [place].   Contacts, glasses, dentures or bridgework may not be worn into surgery.   Bring small overnight bag day of surgery.   DO NOT [place]. PHARMACY WILL DISPENSE MEDICATIONS LISTED ON YOUR MEDICATION LIST TO YOU DURING YOUR ADMISSION [place]!   Special Instructions: Bring a copy of your healthcare power of attorney and living will documents the day of surgery if you haven't scanned them before.              Please read over the following fact sheets you were given: IF Joe Jones    If you received a COVID test during your pre-op visit  it is requested that you wear a mask when out in public, stay away from anyone that may not be feeling well and notify your surgeon if you develop symptoms. If you test positive for Covid or have been in contact with anyone that has tested positive in the last 10 days please notify you surgeon.    [place] - Preparing for Surgery Before surgery, you can play an important role.  Because skin is not sterile, your skin needs to be as free of germs as possible.  You can reduce the number of germs on your skin by washing with CHG (chlorahexidine gluconate) soap before surgery.  CHG is an antiseptic cleaner which kills germs and bonds with the skin to continue killing germs even after  washing. Please DO NOT use if you have an allergy to CHG or antibacterial soaps.  If your skin becomes reddened/irritated stop using the CHG and inform your nurse when you arrive at Short Stay. Do not shave (including legs and underarms) for at least 48 hours prior to the first CHG shower.  You may shave your face/neck.  Please follow these instructions carefully:  1.  Shower with CHG Soap the night before surgery and the  morning of surgery.  2.  If you choose to wash your hair, wash your hair first as usual with your normal  shampoo.  3.  After you shampoo, rinse your hair and body thoroughly to remove the shampoo.                             4.  Use CHG as you would any other liquid soap.  You can apply chg directly to the skin and wash.  Gently with a scrungie or clean washcloth.  5.  Apply the CHG Soap to your body ONLY FROM THE NECK DOWN.   Do   not use on face/ open                           Wound or open sores. Avoid contact with eyes, ears mouth and   genitals (private parts).  Wash face,  Genitals (private parts) with your normal soap.             6.  Wash thoroughly, paying special attention to the area where your    surgery  will be performed.  7.  Thoroughly rinse your body with warm water from the neck down.  8.  DO NOT shower/wash with your normal soap after using and rinsing off the CHG Soap.                9.  Pat yourself dry with a clean towel.            10.  Wear clean pajamas.            11.  Place clean sheets on your bed the night of your first shower and do not  sleep with pets. Day of Surgery : Do not apply any lotions/deodorants the morning of surgery.  Please wear clean clothes to the hospital/surgery center.  FAILURE TO FOLLOW THESE INSTRUCTIONS MAY RESULT IN THE CANCELLATION OF YOUR SURGERY  PATIENT SIGNATURE_________________________________  NURSE  SIGNATURE__________________________________  ________________________________________________________________________

## 2022-12-13 ENCOUNTER — Encounter (HOSPITAL_COMMUNITY): Payer: Self-pay

## 2022-12-13 ENCOUNTER — Encounter (HOSPITAL_COMMUNITY)
Admission: RE | Admit: 2022-12-13 | Discharge: 2022-12-13 | Disposition: A | Discharge status: Home / Self Care | Payer: PPO | Source: Ambulatory Visit | Attending: Surgery | Admitting: Surgery

## 2022-12-13 VITALS — BP 147/77 | HR 81 | Temp 98.4°F | Resp 14 | Ht 74.0 in | Wt 256.0 lb

## 2022-12-13 DIAGNOSIS — I10 Essential (primary) hypertension: Secondary | ICD-10-CM | POA: Diagnosis not present

## 2022-12-13 DIAGNOSIS — Z01818 Encounter for other preprocedural examination: Secondary | ICD-10-CM | POA: Diagnosis not present

## 2022-12-13 DIAGNOSIS — R9431 Abnormal electrocardiogram [ECG] [EKG]: Secondary | ICD-10-CM | POA: Diagnosis not present

## 2022-12-13 LAB — CBC
HCT: 44.8 % (ref 39.0–52.0)
Hemoglobin: 15.1 g/dL (ref 13.0–17.0)
MCH: 33.1 pg (ref 26.0–34.0)
MCHC: 33.7 g/dL (ref 30.0–36.0)
MCV: 98.2 fL (ref 80.0–100.0)
Platelets: 356 10*3/uL (ref 150–400)
RBC: 4.56 MIL/uL (ref 4.22–5.81)
RDW: 11.7 % (ref 11.5–15.5)
WBC: 10.5 10*3/uL (ref 4.0–10.5)
nRBC: 0 % (ref 0.0–0.2)

## 2022-12-13 LAB — BASIC METABOLIC PANEL
Anion gap: 10 (ref 5–15)
BUN: 13 mg/dL (ref 8–23)
CO2: 25 mmol/L (ref 22–32)
Calcium: 9.7 mg/dL (ref 8.9–10.3)
Chloride: 101 mmol/L (ref 98–111)
Creatinine, Ser: 1 mg/dL (ref 0.61–1.24)
GFR, Estimated: >60 mL/min (ref >60)
Glucose, Bld: 123 mg/dL — ABNORMAL HIGH (ref 70–99)
Potassium: 4.8 mmol/L (ref 3.5–5.1)
Sodium: 136 mmol/L (ref 135–145)

## 2022-12-27 ENCOUNTER — Encounter (HOSPITAL_COMMUNITY): Payer: Self-pay | Admitting: Surgery

## 2022-12-27 ENCOUNTER — Inpatient Hospital Stay (HOSPITAL_COMMUNITY): Payer: PPO | Admitting: Certified Registered Nurse Anesthetist

## 2022-12-27 ENCOUNTER — Inpatient Hospital Stay (HOSPITAL_COMMUNITY)
Admission: RE | Admit: 2022-12-27 | Discharge: 2022-12-28 | DRG: 909 | Disposition: A | Discharge status: Home / Self Care | Payer: PPO | Attending: Surgery | Admitting: Surgery

## 2022-12-27 ENCOUNTER — Encounter (HOSPITAL_COMMUNITY)
Admission: RE | Disposition: A | Discharge status: Home / Self Care | Payer: Self-pay | Source: Home / Self Care | Attending: Surgery

## 2022-12-27 ENCOUNTER — Other Ambulatory Visit: Payer: Self-pay

## 2022-12-27 ENCOUNTER — Inpatient Hospital Stay (HOSPITAL_COMMUNITY): Payer: PPO | Admitting: Physician Assistant

## 2022-12-27 DIAGNOSIS — Z79899 Other long term (current) drug therapy: Secondary | ICD-10-CM

## 2022-12-27 DIAGNOSIS — K432 Incisional hernia without obstruction or gangrene: Secondary | ICD-10-CM

## 2022-12-27 DIAGNOSIS — K760 Fatty (change of) liver, not elsewhere classified: Secondary | ICD-10-CM | POA: Diagnosis present

## 2022-12-27 DIAGNOSIS — Y838 Other surgical procedures as the cause of abnormal reaction of the patient, or of later complication, without mention of misadventure at the time of the procedure: Secondary | ICD-10-CM | POA: Diagnosis present

## 2022-12-27 DIAGNOSIS — I10 Essential (primary) hypertension: Secondary | ICD-10-CM | POA: Diagnosis not present

## 2022-12-27 DIAGNOSIS — F17220 Nicotine dependence, chewing tobacco, uncomplicated: Secondary | ICD-10-CM | POA: Diagnosis present

## 2022-12-27 DIAGNOSIS — Z9079 Acquired absence of other genital organ(s): Secondary | ICD-10-CM | POA: Diagnosis not present

## 2022-12-27 DIAGNOSIS — K660 Peritoneal adhesions (postprocedural) (postinfection): Secondary | ICD-10-CM | POA: Diagnosis not present

## 2022-12-27 DIAGNOSIS — K439 Ventral hernia without obstruction or gangrene: Secondary | ICD-10-CM | POA: Diagnosis not present

## 2022-12-27 DIAGNOSIS — T83728A Exposure of other implanted mesh and other prosthetic materials to surrounding organ or tissue, initial encounter: Secondary | ICD-10-CM | POA: Diagnosis not present

## 2022-12-27 DIAGNOSIS — T85628A Displacement of other specified internal prosthetic devices, implants and grafts, initial encounter: Principal | ICD-10-CM | POA: Diagnosis present

## 2022-12-27 DIAGNOSIS — Z8546 Personal history of malignant neoplasm of prostate: Secondary | ICD-10-CM

## 2022-12-27 HISTORY — PX: INCISIONAL HERNIA REPAIR: SHX193

## 2022-12-27 LAB — CBC
HCT: 41.6 % (ref 39.0–52.0)
Hemoglobin: 14.2 g/dL (ref 13.0–17.0)
MCH: 33.6 pg (ref 26.0–34.0)
MCHC: 34.1 g/dL (ref 30.0–36.0)
MCV: 98.3 fL (ref 80.0–100.0)
Platelets: 355 10*3/uL (ref 150–400)
RBC: 4.23 MIL/uL (ref 4.22–5.81)
RDW: 11.9 % (ref 11.5–15.5)
WBC: 19.9 10*3/uL — ABNORMAL HIGH (ref 4.0–10.5)
nRBC: 0 % (ref 0.0–0.2)

## 2022-12-27 LAB — CREATININE, SERUM
Creatinine, Ser: 1.22 mg/dL (ref 0.61–1.24)
GFR, Estimated: >60 mL/min (ref >60)

## 2022-12-27 SURGERY — REPAIR, HERNIA, INCISIONAL
Anesthesia: General | Site: Abdomen

## 2022-12-27 MED ORDER — GABAPENTIN 300 MG PO CAPS
300.0000 mg | ORAL_CAPSULE | ORAL | Status: AC
  Administered 2022-12-27: 300 mg via ORAL
  Filled 2022-12-27: qty 1

## 2022-12-27 MED ORDER — LIDOCAINE 2% (20 MG/ML) 5 ML SYRINGE
INTRAMUSCULAR | Status: DC | PRN
  Administered 2022-12-27: 60 mg via INTRAVENOUS

## 2022-12-27 MED ORDER — BUPIVACAINE LIPOSOME 1.3 % IJ SUSP
INTRAMUSCULAR | Status: AC
  Filled 2022-12-27: qty 20

## 2022-12-27 MED ORDER — ROCURONIUM BROMIDE 10 MG/ML (PF) SYRINGE
PREFILLED_SYRINGE | INTRAVENOUS | Status: AC
  Filled 2022-12-27: qty 10

## 2022-12-27 MED ORDER — ENOXAPARIN SODIUM 40 MG/0.4ML IJ SOSY
40.0000 mg | PREFILLED_SYRINGE | INTRAMUSCULAR | Status: DC
  Administered 2022-12-28: 40 mg via SUBCUTANEOUS
  Filled 2022-12-27: qty 0.4

## 2022-12-27 MED ORDER — OXYCODONE HCL 5 MG PO TABS: 5.0000 mg | ORAL_TABLET | Freq: Once | ORAL | Status: DC | PRN

## 2022-12-27 MED ORDER — SIMETHICONE 80 MG PO CHEW: 80.0000 mg | CHEWABLE_TABLET | Freq: Four times a day (QID) | ORAL | Status: DC | PRN

## 2022-12-27 MED ORDER — CHLORHEXIDINE GLUCONATE 0.12 % MT SOLN
15.0000 mL | Freq: Once | OROMUCOSAL | Status: AC
  Administered 2022-12-27: 15 mL via OROMUCOSAL

## 2022-12-27 MED ORDER — LIDOCAINE HCL (PF) 2 % IJ SOLN
INTRAMUSCULAR | Status: AC
  Filled 2022-12-27: qty 20

## 2022-12-27 MED ORDER — DEXAMETHASONE SODIUM PHOSPHATE 4 MG/ML IJ SOLN
INTRAMUSCULAR | Status: DC | PRN
  Administered 2022-12-27: 5 mg via INTRAVENOUS

## 2022-12-27 MED ORDER — ONDANSETRON HCL 4 MG/2ML IJ SOLN
INTRAMUSCULAR | Status: AC
  Filled 2022-12-27: qty 2

## 2022-12-27 MED ORDER — PHENYLEPHRINE 80 MCG/ML (10ML) SYRINGE FOR IV PUSH (FOR BLOOD PRESSURE SUPPORT)
PREFILLED_SYRINGE | INTRAVENOUS | Status: AC
  Filled 2022-12-27: qty 10

## 2022-12-27 MED ORDER — ACETAMINOPHEN 500 MG PO TABS
1000.0000 mg | ORAL_TABLET | ORAL | Status: AC
  Administered 2022-12-27: 1000 mg via ORAL
  Filled 2022-12-27: qty 2

## 2022-12-27 MED ORDER — OXYCODONE HCL 5 MG PO TABS: 5.0000 mg | ORAL_TABLET | ORAL | Status: DC | PRN

## 2022-12-27 MED ORDER — ORAL CARE MOUTH RINSE: 15.0000 mL | Freq: Once | OROMUCOSAL | Status: AC

## 2022-12-27 MED ORDER — HYDROMORPHONE HCL 2 MG/ML IJ SOLN
INTRAMUSCULAR | Status: AC
  Filled 2022-12-27: qty 1

## 2022-12-27 MED ORDER — KETOROLAC TROMETHAMINE 30 MG/ML IJ SOLN
INTRAMUSCULAR | Status: AC
  Filled 2022-12-27: qty 1

## 2022-12-27 MED ORDER — KETOROLAC TROMETHAMINE 15 MG/ML IJ SOLN
15.0000 mg | INTRAMUSCULAR | Status: AC
  Administered 2022-12-27: 15 mg via INTRAVENOUS

## 2022-12-27 MED ORDER — LIDOCAINE HCL (PF) 2 % IJ SOLN
INTRAMUSCULAR | Status: DC | PRN
  Administered 2022-12-27: 1.5 mg/kg/h via INTRADERMAL

## 2022-12-27 MED ORDER — LACTATED RINGERS IV SOLN: INTRAVENOUS | Status: DC

## 2022-12-27 MED ORDER — METHOCARBAMOL 500 MG IVPB - SIMPLE MED: 500.0000 mg | Freq: Four times a day (QID) | INTRAVENOUS | Status: DC | PRN

## 2022-12-27 MED ORDER — SUGAMMADEX SODIUM 500 MG/5ML IV SOLN
INTRAVENOUS | Status: AC
  Filled 2022-12-27: qty 5

## 2022-12-27 MED ORDER — FENTANYL CITRATE (PF) 250 MCG/5ML IJ SOLN
INTRAMUSCULAR | Status: AC
  Filled 2022-12-27: qty 5

## 2022-12-27 MED ORDER — DEXAMETHASONE SODIUM PHOSPHATE 10 MG/ML IJ SOLN
INTRAMUSCULAR | Status: AC
  Filled 2022-12-27: qty 1

## 2022-12-27 MED ORDER — ROCURONIUM BROMIDE 10 MG/ML (PF) SYRINGE
PREFILLED_SYRINGE | INTRAVENOUS | Status: DC | PRN
  Administered 2022-12-27: 20 mg via INTRAVENOUS
  Administered 2022-12-27: 50 mg via INTRAVENOUS
  Administered 2022-12-27: 10 mg via INTRAVENOUS
  Administered 2022-12-27: 20 mg via INTRAVENOUS
  Administered 2022-12-27 (×2): 25 mg via INTRAVENOUS

## 2022-12-27 MED ORDER — ENOXAPARIN SODIUM 40 MG/0.4ML IJ SOSY
40.0000 mg | PREFILLED_SYRINGE | Freq: Once | INTRAMUSCULAR | Status: AC
  Administered 2022-12-27: 40 mg via SUBCUTANEOUS
  Filled 2022-12-27: qty 0.4

## 2022-12-27 MED ORDER — PROPOFOL 10 MG/ML IV BOLUS
INTRAVENOUS | Status: AC
  Filled 2022-12-27: qty 20

## 2022-12-27 MED ORDER — DOCUSATE SODIUM 100 MG PO CAPS
100.0000 mg | ORAL_CAPSULE | Freq: Two times a day (BID) | ORAL | Status: DC
  Administered 2022-12-27 – 2022-12-28 (×2): 100 mg via ORAL
  Filled 2022-12-27 (×2): qty 1

## 2022-12-27 MED ORDER — GABAPENTIN 300 MG PO CAPS
300.0000 mg | ORAL_CAPSULE | Freq: Three times a day (TID) | ORAL | Status: DC
  Administered 2022-12-27 – 2022-12-28 (×2): 300 mg via ORAL
  Filled 2022-12-27 (×2): qty 1

## 2022-12-27 MED ORDER — LIDOCAINE HCL 2 % IJ SOLN
INTRAMUSCULAR | Status: AC
  Filled 2022-12-27: qty 20

## 2022-12-27 MED ORDER — FENTANYL CITRATE (PF) 100 MCG/2ML IJ SOLN
INTRAMUSCULAR | Status: DC | PRN
  Administered 2022-12-27: 50 ug via INTRAVENOUS
  Administered 2022-12-27 (×2): 100 ug via INTRAVENOUS

## 2022-12-27 MED ORDER — FENTANYL CITRATE PF 50 MCG/ML IJ SOSY: 25.0000 ug | PREFILLED_SYRINGE | INTRAMUSCULAR | Status: DC | PRN

## 2022-12-27 MED ORDER — INSULIN ASPART 100 UNIT/ML IJ SOLN: 2.0000 [IU] | Freq: Once | INTRAMUSCULAR | Status: DC

## 2022-12-27 MED ORDER — ONDANSETRON HCL 4 MG/2ML IJ SOLN: 4.0000 mg | Freq: Four times a day (QID) | INTRAMUSCULAR | Status: DC | PRN

## 2022-12-27 MED ORDER — BUPIVACAINE HCL 0.25 % IJ SOLN
INTRAMUSCULAR | Status: AC
  Filled 2022-12-27: qty 1

## 2022-12-27 MED ORDER — HYDROMORPHONE HCL 1 MG/ML IJ SOLN
INTRAMUSCULAR | Status: DC | PRN
  Administered 2022-12-27: .5 mg via INTRAVENOUS

## 2022-12-27 MED ORDER — OXYCODONE HCL 5 MG/5ML PO SOLN: 5.0000 mg | Freq: Once | ORAL | Status: DC | PRN

## 2022-12-27 MED ORDER — CEFAZOLIN SODIUM-DEXTROSE 2-4 GM/100ML-% IV SOLN
2.0000 g | INTRAVENOUS | Status: AC
  Administered 2022-12-27: 2 g via INTRAVENOUS
  Filled 2022-12-27: qty 100

## 2022-12-27 MED ORDER — 0.9 % SODIUM CHLORIDE (POUR BTL) OPTIME
TOPICAL | Status: DC | PRN
  Administered 2022-12-27: 1000 mL

## 2022-12-27 MED ORDER — BUPIVACAINE HCL (PF) 0.25 % IJ SOLN
INTRAMUSCULAR | Status: DC | PRN
  Administered 2022-12-27: 30 mL

## 2022-12-27 MED ORDER — CHLORHEXIDINE GLUCONATE CLOTH 2 % EX PADS: 6.0000 | MEDICATED_PAD | Freq: Once | CUTANEOUS | Status: DC

## 2022-12-27 MED ORDER — PROPOFOL 10 MG/ML IV BOLUS
INTRAVENOUS | Status: DC | PRN
  Administered 2022-12-27: 200 mg via INTRAVENOUS

## 2022-12-27 MED ORDER — ACETAMINOPHEN 325 MG PO TABS
650.0000 mg | ORAL_TABLET | Freq: Four times a day (QID) | ORAL | Status: DC
  Administered 2022-12-27 – 2022-12-28 (×3): 650 mg via ORAL
  Filled 2022-12-27 (×3): qty 2

## 2022-12-27 MED ORDER — PROCHLORPERAZINE EDISYLATE 10 MG/2ML IJ SOLN: 10.0000 mg | INTRAMUSCULAR | Status: DC | PRN

## 2022-12-27 MED ORDER — BUPIVACAINE LIPOSOME 1.3 % IJ SUSP
20.0000 mL | Freq: Once | INTRAMUSCULAR | Status: AC
  Administered 2022-12-27: 20 mL

## 2022-12-27 MED ORDER — OXYCODONE HCL 5 MG PO TABS
10.0000 mg | ORAL_TABLET | ORAL | Status: DC | PRN
  Administered 2022-12-27: 10 mg via ORAL
  Filled 2022-12-27: qty 2

## 2022-12-27 MED ORDER — SUGAMMADEX SODIUM 200 MG/2ML IV SOLN
INTRAVENOUS | Status: DC | PRN
  Administered 2022-12-27: 300 mg via INTRAVENOUS

## 2022-12-27 MED ORDER — ONDANSETRON HCL 4 MG/2ML IJ SOLN
INTRAMUSCULAR | Status: DC | PRN
  Administered 2022-12-27: 4 mg via INTRAVENOUS

## 2022-12-27 MED ORDER — HYDROMORPHONE HCL 1 MG/ML IJ SOLN: 1.0000 mg | INTRAMUSCULAR | Status: DC | PRN

## 2022-12-27 SURGICAL SUPPLY — 39 items
ADH SKN CLS APL DERMABOND .7 (GAUZE/BANDAGES/DRESSINGS) ×1
APL PRP STRL LF DISP 70% ISPRP (MISCELLANEOUS) ×1
BAG COUNTER SPONGE SURGICOUNT (BAG) ×1 IMPLANT
BAG SPNG CNTER NS LX DISP (BAG) ×1
BINDER ABDOMINAL 12 ML 46-62 (SOFTGOODS) IMPLANT
CHLORAPREP W/TINT 26 (MISCELLANEOUS) ×1 IMPLANT
COVER SURGICAL LIGHT HANDLE (MISCELLANEOUS) ×1 IMPLANT
DERMABOND ADVANCED .7 DNX12 (GAUZE/BANDAGES/DRESSINGS) ×1 IMPLANT
DRAIN CHANNEL 19F RND (DRAIN) IMPLANT
DRAPE LAPAROSCOPIC ABDOMINAL (DRAPES) ×1 IMPLANT
ELECT BLADE TIP CTD 4 INCH (ELECTRODE) ×1 IMPLANT
ELECT PENCIL ROCKER SW 15FT (MISCELLANEOUS) ×1 IMPLANT
ELECT REM PT RETURN 15FT ADLT (MISCELLANEOUS) ×1 IMPLANT
EVACUATOR SILICONE 100CC (DRAIN) IMPLANT
GAUZE 4X4 16PLY ~~LOC~~+RFID DBL (SPONGE) IMPLANT
GAUZE SPONGE 4X4 12PLY STRL (GAUZE/BANDAGES/DRESSINGS) ×1 IMPLANT
GLOVE BIO SURGEON STRL SZ7.5 (GLOVE) ×1 IMPLANT
GLOVE BIOGEL PI IND STRL 8 (GLOVE) ×1 IMPLANT
GOWN STRL REUS W/ TWL XL LVL3 (GOWN DISPOSABLE) ×3 IMPLANT
GOWN STRL REUS W/TWL XL LVL3 (GOWN DISPOSABLE) ×3
HANDLE SUCTION POOLE (INSTRUMENTS) IMPLANT
KIT BASIN OR (CUSTOM PROCEDURE TRAY) ×1 IMPLANT
KIT TURNOVER KIT A (KITS) IMPLANT
MARKER SKIN DUAL TIP RULER LAB (MISCELLANEOUS) ×1 IMPLANT
MESH SOFT 12X12IN BARD (Mesh General) IMPLANT
PACK GENERAL/GYN (CUSTOM PROCEDURE TRAY) ×1 IMPLANT
SPONGE DRAIN TRACH 4X4 STRL 2S (GAUZE/BANDAGES/DRESSINGS) IMPLANT
SUCTION POOLE HANDLE (INSTRUMENTS) ×1
SUT ETHILON 2 0 PS N (SUTURE) IMPLANT
SUT MNCRL AB 4-0 PS2 18 (SUTURE) ×1 IMPLANT
SUT PDS AB 2-0 CT2 27 (SUTURE) ×4 IMPLANT
SUT SILK 0 SH 30 (SUTURE) IMPLANT
SUT STRATAFIX PDS+ 0 24IN (SUTURE) IMPLANT
SUT VIC AB 2-0 SH 18 (SUTURE) IMPLANT
SUT VIC AB 3-0 SH 8-18 (SUTURE) ×1 IMPLANT
TOWEL OR 17X26 10 PK STRL BLUE (TOWEL DISPOSABLE) ×1 IMPLANT
TOWEL OR NON WOVEN STRL DISP B (DISPOSABLE) ×1 IMPLANT
TOWEL ~~LOC~~+RFID 17X26 BLUE (SPONGE) IMPLANT
TRAY FOLEY MTR SLVR 16FR STAT (SET/KITS/TRAYS/PACK) IMPLANT

## 2022-12-27 NOTE — Anesthesia Procedure Notes (Signed)
Procedure Name: Intubation Date/Time: 12/27/2022 12:45 PM  Performed by: Claudia Desanctis, CRNAPre-anesthesia Checklist: Patient identified, Emergency Drugs available, Suction available and Patient being monitored Patient Re-evaluated:Patient Re-evaluated prior to induction Oxygen Delivery Method: Circle system utilized Preoxygenation: Pre-oxygenation with 100% oxygen Induction Type: IV induction Ventilation: Mask ventilation without difficulty Laryngoscope Size: Miller and 3 Grade View: Grade I Tube type: Oral Tube size: 7.5 mm Number of attempts: 1 Airway Equipment and Method: Stylet Placement Confirmation: ETT inserted through vocal cords under direct vision, positive ETCO2 and breath sounds checked- equal and bilateral Secured at: 22 cm Tube secured with: Tape Dental Injury: Teeth and Oropharynx as per pre-operative assessment

## 2022-12-27 NOTE — Transfer of Care (Signed)
Immediate Anesthesia Transfer of Care Note  Patient: Joe Jones  Procedure(s) Performed: OPEN RECURRENT INCISIONAL HERNIA REPAIR WITH MESH AND COMPONENT SEPARATION AND REMOVAL OF MESH (Abdomen)  Patient Location: PACU  Anesthesia Type:General  Level of Consciousness: awake, alert , and oriented  Airway & Oxygen Therapy: Patient Spontanous Breathing and Patient connected to face mask oxygen  Post-op Assessment: Report given to RN and Post -op Vital signs reviewed and stable  Post vital signs: Reviewed and stable  Last Vitals:  Vitals Value Taken Time  BP 142/78   Temp    Pulse 88 12/27/22 1539  Resp 21 12/27/22 1539  SpO2 100 % 12/27/22 1539  Vitals shown include unvalidated device data.  Last Pain:  Vitals:   12/27/22 1146  TempSrc:   PainSc: 0-No pain         Complications: No notable events documented.

## 2022-12-27 NOTE — H&P (Signed)
Admitting Physician: Nickola Major Oni Dietzman  Service: General Surgery  CC: Hernia  Subjective   HPI: Joe Jones is an 72 y.o. male who is here for hernia repair.   Joe Jones underwent radical prostatectomy on 03/19/21. He then underwent laparoscopic converted to open retro-rectus repair of incisional hernia with mesh on 11/12/21 with Dr. Lilia Pro. The hernia has recurred. He was sent to my office to be evaluated for recurrent hernia repair with component separation.      Past Medical History:  Diagnosis Date   Cancer [facility])    prostate   Hypertension     Past Surgical History:  Procedure Laterality Date   BACK SURGERY     HEMORROIDECTOMY     LYMPHADENECTOMY Bilateral 03/19/2021   Procedure: LYMPHADENECTOMY, PELVIC;  Surgeon: Raynelle Bring, MD;  Location: [facility] ORS;  Service: Urology;  Laterality: Bilateral;   ROBOT ASSISTED LAPAROSCOPIC RADICAL PROSTATECTOMY N/A 03/19/2021   Procedure: XI ROBOTIC ASSISTED LAPAROSCOPIC RADICAL PROSTATECTOMY LEVEL 3;  Surgeon: Raynelle Bring, MD;  Location: [facility] ORS;  Service: Urology;  Laterality: N/A;  ONLY NEEDS 210 MIN   TRANSURETHRAL RESECTION OF PROSTATE      No family history on file.  Social:  reports that he quit smoking about 12 years ago. His smoking use included cigarettes. He smoked an average of 1.5 packs per day. His smokeless tobacco use includes chew. He reports current alcohol use. He reports that he does not use drugs.  Allergies: No Known Allergies  Medications: Current Outpatient Medications  Medication Instructions   amLODipine (NORVASC) 10 mg, Oral, Daily   Cyanocobalamin (B-12 PO) 1 tablet, Oral, Daily   docusate sodium (COLACE) 100 mg, Oral, 2 times daily   hydrochlorothiazide (HYDRODIURIL) 25 mg, Oral, Every morning   lisinopril (ZESTRIL) 10 mg, Oral, Daily   rosuvastatin (CRESTOR) 10 mg, Oral, Daily at bedtime   sulfamethoxazole-trimethoprim (BACTRIM DS) 800-160 MG tablet 1 tablet, Oral, 2 times daily, Start the  day prior to foley removal appointment   traMADol (ULTRAM) 50-100 mg, Oral, Every 6 hours PRN    ROS - all of the below systems have been reviewed with the patient and positives are indicated with bold text General: chills, fever or night sweats Eyes: blurry vision or double vision ENT: epistaxis or sore throat Allergy/Immunology: itchy/watery eyes or nasal congestion Hematologic/Lymphatic: bleeding problems, blood clots or swollen lymph nodes Endocrine: temperature intolerance or unexpected weight changes Breast: new or changing breast lumps or nipple discharge Resp: cough, shortness of breath, or wheezing CV: chest pain or dyspnea on exertion GI: as per HPI GU: dysuria, trouble voiding, or hematuria MSK: joint pain or joint stiffness Neuro: TIA or stroke symptoms Derm: pruritus and skin lesion changes Psych: anxiety and depression  Objective   PE Blood pressure (!) 145/77, pulse 91, temperature 98.2 F (36.8 C), temperature source Oral, resp. rate 16, height '6\' 2"'$  (1.88 m), weight 116.1 kg, SpO2 99 %. Constitutional: NAD; conversant; no deformities Eyes: Moist conjunctiva; no lid lag; anicteric; PERRL Neck: Trachea midline; no thyromegaly Lungs: Normal respiratory effort; no tactile fremitus CV: RRR; no palpable thrills; no pitting edema GI: -broad-based midline epigastric/supraumbilical hernia bulging to the right abdomen. Reducible. Tender on palpation.  MSK: Normal range of motion of extremities; no clubbing/cyanosis Psychiatric: Appropriate affect; alert and oriented x3 Lymphatic: No palpable cervical or axillary lymphadenopathy  No results found for this or any previous visit (from the past 24 hour(s)).  Imaging Orders     CT Abd/Pel 10/15/22 Impression: Apparent failed  prior repair of the ventral incisional hernia with displacement of mesh and a broadbased fat containing hernia. No bowel herniation Severe colonic diverticulosis. No bowel obstruction. Normal  appendix Fatty liver with possible early changes of cirrhosis Aortic Atherosclerosis    Assessment and Plan    Recurrent ventral incisional hernia - CT 10.3 cm tall 7.7 cm wide defect   I recommended open recurrent incisional hernia repair with removal of previous retroperitoneal mesh, bilateral rectus myofascial release and transversus abdominis release with retromuscular placement of mesh. The procedure itself as well as its risk, benefits, and alternatives were discussed with the patient in full. After full discussion all questions answered the patient granted consent to proceed.  [name] [name] [name], MD  [facility] Use [email or web address] to contact on call provider

## 2022-12-27 NOTE — Op Note (Signed)
Patient: Joe Jones ([birth year], [id number])  Date of Surgery: 12/27/2022   Preoperative Diagnosis: RECURRENT VENTRAL HERNIA  (Hernia defect size on CT imaging 10.3 cm tall 7.7 cm wide)   Postoperative Diagnosis: RECURRENT VENTRAL HERNIA   Surgical Procedure:  Open recurrent incisional hernia repair with retro-muscular mesh placement Removal of previous retro-muscular mesh Bilateral posterior rectus myofascial release Bilateral transversus abdominis myofascial release   Operative Team Members:  Surgeon(s) and Role:    * Arel Tippen, Nickola Major, MD - Primary   Eben Burow, MD - [facility] Resident Assistant  Anesthesiologist: Albertha Ghee, MD CRNA: Claudia Desanctis, CRNA; Eben Burow, CRNA; Genelle Bal, CRNA   Anesthesia: General   Fluids:  Total I/O In: 1000 [I.V.:900; IV Piggyback:100] Out: 50 [RSWNI:62]  Complications: None  Drains:  19 Fr. Blake drain in the retromuscular position  Specimen: None  Disposition:  PACU - hemodynamically stable.  Plan of Care: Admit to inpatient   Indications for Procedure: Joe Jones is a 72 y.o. male who presented with a recurrent incisional hernia.  The hernia defect measured 10.3 cm tall x 7.7 cm wide on preoperative CT.  I recommended open recurrent incisional hernia repair with removal of previous retroperitoneal mesh, bilateral rectus myofascial release and transversus abdominis release with retromuscular placement of mesh. The procedure itself as well as its risk, benefits, and alternatives were discussed with the patient in full. After full discussion all questions answered the patient granted consent to proceed.   Findings:  Hernia Location: Ventral hernia location: Epigastric (M2) Hernia Size:  10.3 cm tall x 7.7 cm wide on preoperative CT - unable to measure defect prior to enlarging due to scarring to previous mesh Mesh Size &Type:  25cm x 25 cm Bard Soft Mesh Mesh Position: Sublay -  Retromuscular Myofascial Releases:  Bilateral posterior rectus myofascial release Bilateral transversus abdominis myofascial release  Removed mesh:   Description of Procedure:  The patient was positioned supine on the operating room table, adequately padded and secured.  A timeout procedure was performed.  A midline incision was made and dissection was carried down through subcutaneous tissue. The prior scar was excised, completely. The abdomen was entered safely.  There were some adhesions.  A meticulous and tedious sharp lysis of adhesions was carried out.  This was completed with no injury to the viscera.  After the anterior abdominal wall was cleared off of all adhesions, a large safety towel was placed over the viscera to protect them.  The hernia defect was located in the epigastric region. The total hernia defect area was unable to be accurately measured due to scarring of the fascial edges to the previous eventrating mesh.   The previous retro-muscular mesh was excised.  It appeared to be polypropylene.  I used cut on the bovie electrocautery to dissect the mesh free of its attachments.  The central portion of the mesh was free and eventrating out the hernia defect.  The left lateral aspect of the mesh was well incorporated into the muscle and required a tedious dissection to fully free and remove the mesh.  Transfacial prolene sutures were removed as they were encountered.    A rectus myofascial release was performed on the LEFT side. The posterior rectus sheath was incised just lateral to the hernia edge.  This incision in the posterior rectus sheath was extended both cephalad and caudal to the hernia defect.  Dissection was carried out laterally in the retromuscular plane to the edge of the  rectus sheath progressively disconnecting the rectus muscle from the underlying posterior rectus sheath. The segmental innervation comprised of intercostal nerve branches 8, 9, 10, 11 and 12 were  individually identified and preserved.  The arterial blood supply to the rectus muscle comprised of the superior and inferior epigastric arteries along with the small terminal branches from the posterior intercostal arteries 10, 11 and 12, the subcostal artery, the posterior lumbar arteries, and the deep circumflex artery were all identified and individually preserved.  The rectus myofascial release accomplished medialization of the posterior rectus sheath towards the midline and disinsertion of the rectus muscle from its surrounding fascia, and thus its encasement in the rectus sheath, allowing for widening of the rectus muscle and transfer of the rectus flap towards the midline.  This will allow for future inset of the medial aspect of the flap for abdominal wall reconstruction.  A transversus abdominis release (TAR) was performed on the LEFT side.  The transversus abdominis muscle was identified deep to the posterior rectus sheath and incised vertically along its entire length, entering the pre-peritoneal or pre-transversalis fascia plane.  This disinserted the transversus abdominis muscle from the linea semilunaris.  Since the intercostal nerves, arteries and veins had been preserved during the rectus myofascial release portion of the procedure, they remained intact during the TAR.  The peritoneum was subsequently peeled away from the underside of the divided transversus abdominis muscle.  This dissection was carried out laterally towards the retroperitoneum.  The TAR accomplished additional medialization of the posterior rectus sheath with its attached peritoneum towards the midline to allow for visceral sac closure.  The TAR also provided further offset of tension of the rectus muscle flap with additional transfer of the rectus muscle towards the midline, as it remained attached to the external and internal abdominal oblique muscles.  This will allow for future inset of the medial aspect of the flap for  abdominal wall reconstruction.  A rectus myofascial release was performed on the RIGHT side. The posterior rectus sheath was incised just lateral to the hernia edge.  This incision in the posterior rectus sheath was extended both cephalad and caudal to the hernia defect.  Dissection was carried out laterally in the retromuscular plane to the edge of the rectus sheath progressively disconnecting the rectus muscle from the underlying posterior rectus sheath. The segmental innervation comprised of intercostal nerve branches 8, 9, 10, 11 and 12 were individually identified and preserved.  The arterial blood supply to the rectus muscle comprised of the superior and inferior epigastric arteries along with the small terminal branches from the posterior intercostal arteries 10, 11 and 12, the subcostal artery, the posterior lumbar arteries, and the deep circumflex artery were all identified and individually preserved.  The rectus myofascial release accomplished medialization of the posterior rectus sheath towards the midline and disinsertion of the rectus muscle from its surrounding fascia, and thus its encasement in the rectus sheath, allowing for widening of the rectus muscle and transfer of the rectus flap towards the midline.  This will allow for future inset of the medial aspect of the flap for abdominal wall reconstruction.  A transversus abdominis release (TAR) was performed on the RIGHT side.   The transversus abdominis muscle was identified deep to the posterior rectus sheath and incised vertically along its entire length, entering the pre-peritoneal or pre-transversalis fascia plane.  This disinserted the transversus abdominis muscle from the linea semilunaris.  Since the intercostal nerves, arteries and veins had been preserved during the  rectus myofascial release portion of the procedure, they remained intact during the TAR. The peritoneum was subsequently peeled away from the underside of the divided  transversus abdominis muscle.  This dissection was carried out laterally towards the retroperitoneum.  The TAR accomplished additional medialization of the posterior rectus sheath with its attached peritoneum towards the midline to allow for visceral sac closure.  The TAR also provided further offset of tension of the rectus muscle flap with additional transfer of the rectus muscle towards the midline, as it remained attached to the external and internal abdominal oblique muscles.  This will allow for future inset of the medial aspect of the flap for abdominal wall reconstruction.   The towel was removed and the posterior fascia was reapproximated in the midline with a continuous 2-0 vicryl suture. The retromuscular plane was copiously irrigated with warm normal saline. There was good hemostasis of the operative field.   A transversus abdominis plane (TAP) block was performed bilaterally with Exparel and marcaine.  The anesthetic was injected into the plane between the transversus abdominis and internal abdominal oblique muscle bilaterally.  A new set of sterile gloves were used prior to handling the mesh. The mesh listed above was brought into the operative field and cut to the size specified above.  The superior aspect of the mesh was tucked into the retroxiphoid space and was sutured directly to the xiphoid 0 silk suture.    The mesh space was irrigated with saline.  One 59 [place] Blake drain was placed in the retromuscular position. The rectus muscles were re approximated in the midline utilizing a continuous #1 Stratafix suture. The fascia came together well. The subcutaneous tissue was irrigated with saline. Deep dermal layer was reapproximated with interrupted 2-0 Vicryl suture.  The skin was closed with a 4-0 Monocryl subcuticular suture and skin glue.  All sponge and needle counts were correct at the end of this case.   Louanna Raw, MD General, Bariatric, & Minimally Invasive  Surgery [facility], [place]

## 2022-12-27 NOTE — Progress Notes (Signed)
Called patient to let him know that his surgeon is running ahead and he could head to the hospital when he was ready.  Patient stated that it takes about 45 minutes to 1 hour to get to [facility].

## 2022-12-27 NOTE — Anesthesia Preprocedure Evaluation (Signed)
Anesthesia Evaluation  Patient identified by MRN, date of birth, ID band Patient awake    Reviewed: Allergy & Precautions, H&P , NPO status , Patient's Chart, lab work & pertinent test results  Airway Mallampati: II   Neck ROM: full    Dental   Pulmonary former smoker   breath sounds clear to auscultation       Cardiovascular hypertension,  Rhythm:regular Rate:Normal     Neuro/Psych    GI/Hepatic   Endo/Other    Renal/GU    Prostate CA    Musculoskeletal   Abdominal   Peds  Hematology   Anesthesia Other Findings   Reproductive/Obstetrics                             Anesthesia Physical Anesthesia Plan  ASA: 2  Anesthesia Plan: General   Post-op Pain Management:    Induction: Intravenous  PONV Risk Score and Plan: 2 and Ondansetron, Dexamethasone and Treatment may vary due to age or medical condition  Airway Management Planned: Oral ETT  Additional Equipment:   Intra-op Plan:   Post-operative Plan: Extubation in OR  Informed Consent: I have reviewed the patients History and Physical, chart, labs and discussed the procedure including the risks, benefits and alternatives for the proposed anesthesia with the patient or authorized representative who has indicated his/her understanding and acceptance.     Dental advisory given  Plan Discussed with: CRNA, Anesthesiologist and Surgeon  Anesthesia Plan Comments:        Anesthesia Quick Evaluation

## 2022-12-28 ENCOUNTER — Encounter (HOSPITAL_COMMUNITY): Payer: Self-pay | Admitting: Surgery

## 2022-12-28 LAB — CBC
HCT: 37.5 % — ABNORMAL LOW (ref 39.0–52.0)
HCT: 38.8 % — ABNORMAL LOW (ref 39.0–52.0)
Hemoglobin: 12.7 g/dL — ABNORMAL LOW (ref 13.0–17.0)
Hemoglobin: 12.9 g/dL — ABNORMAL LOW (ref 13.0–17.0)
MCH: 33 pg (ref 26.0–34.0)
MCH: 32.8 pg (ref 26.0–34.0)
MCHC: 33.9 g/dL (ref 30.0–36.0)
MCHC: 33.2 g/dL (ref 30.0–36.0)
MCV: 97.4 fL (ref 80.0–100.0)
MCV: 98.7 fL (ref 80.0–100.0)
Platelets: 317 10*3/uL (ref 150–400)
Platelets: 378 10*3/uL (ref 150–400)
RBC: 3.85 MIL/uL — ABNORMAL LOW (ref 4.22–5.81)
RBC: 3.93 MIL/uL — ABNORMAL LOW (ref 4.22–5.81)
RDW: 12 % (ref 11.5–15.5)
RDW: 11.9 % (ref 11.5–15.5)
WBC: 21.9 10*3/uL — ABNORMAL HIGH (ref 4.0–10.5)
WBC: 21.8 10*3/uL — ABNORMAL HIGH (ref 4.0–10.5)
nRBC: 0 % (ref 0.0–0.2)
nRBC: 0 % (ref 0.0–0.2)

## 2022-12-28 LAB — BASIC METABOLIC PANEL
Anion gap: 9 (ref 5–15)
BUN: 23 mg/dL (ref 8–23)
CO2: 21 mmol/L — ABNORMAL LOW (ref 22–32)
Calcium: 8.6 mg/dL — ABNORMAL LOW (ref 8.9–10.3)
Chloride: 102 mmol/L (ref 98–111)
Creatinine, Ser: 1.19 mg/dL (ref 0.61–1.24)
GFR, Estimated: >60 mL/min (ref >60)
Glucose, Bld: 161 mg/dL — ABNORMAL HIGH (ref 70–99)
Potassium: 4.8 mmol/L (ref 3.5–5.1)
Sodium: 132 mmol/L — ABNORMAL LOW (ref 135–145)

## 2022-12-28 MED ORDER — OXYCODONE-ACETAMINOPHEN 5-325 MG PO TABS: 1.0000 | ORAL_TABLET | ORAL | 0 refills | Status: AC | PRN

## 2022-12-28 NOTE — Progress Notes (Signed)
PT Cancellation Note  Patient Details Name: Joe Jones MRN: [id number] DOB: [birth year]   Cancelled Treatment:    Reason Eval/Treat Not Completed: PT screened, no needs identified, will sign off. Patient independently ambulalting, no DME needs. [place] Office [phone] Weekend [phone]   Claretha Cooper 12/28/2022, 12:01 PM

## 2022-12-28 NOTE — Plan of Care (Signed)
Patient is stable for discharge. Discharge instructions have been given. All questions answered, patient is discharged home with family.

## 2022-12-28 NOTE — Evaluation (Signed)
Occupational Therapy Evaluation and Discharge Patient Details Name: Joe Jones MRN: [id number] DOB: [birth year] Today's Date: 12/28/2022   History of Present Illness Joe Jones is a 72 yr old male admitted to the hospital 12-27-22 for hernia repair with mesh placement, due to a recurrent ventral hernia. PMH: prostate CA, radical prostatectomy in April 2022, HTN, hernia repair   Clinical Impression   Joe Jones performed all assessed functional tasks with distant supervision or better, including supine to sit, lower body dressing, sit to stand, and ambulating without an assistive device. He was instructed on general post-op precautions, implementing the log roll technique for bed mobility, and compensatory techniques for lower body dressing. He presented with good understanding, recall, and teach back abilities. He does not require further OT services, therefore OT will sign off and recommend he return home at discharge.       Recommendations for follow up therapy are one component of a multi-disciplinary discharge planning process, led by the attending physician.  Recommendations may be updated based on patient status, additional functional criteria and insurance authorization.   Follow Up Recommendations  No OT follow up     Assistance Recommended at Discharge PRN  Patient can return home with the following Assistance with cooking/housework;Assist for transportation    Functional Status Assessment  Patient has not had a recent decline in their functional status  Equipment Recommendations  None recommended by OT       Precautions / Restrictions Precautions Precaution Comments: abdominal precautions      Mobility Bed Mobility Overal bed mobility: Needs Assistance Bed Mobility: Rolling Rolling: Supervision         General bed mobility comments: OT instructed the pt on implementing the log roll technique for bed mobility.    Transfers Overall transfer level: Needs  assistance   Transfers: Sit to/from Stand Sit to Stand: Supervision                  Balance Overall balance assessment: No apparent balance deficits (not formally assessed)         ADL either performed or assessed with clinical judgement   ADL   Eating/Feeding: Independent;Sitting Eating/Feeding Details (indicate cue type and reason): based on clinical judgement Grooming: Set up Grooming Details (indicate cue type and reason): in standing at sink level, based on clinical judgement         Upper Body Dressing : Independent;Sitting   Lower Body Dressing: Supervision/safety Lower Body Dressing Details (indicate cue type and reason): OT instructed the pt on implementing the figure four technique for lower body dressing, to ensure adherence to abdominal precautions. He demonstrated correct teach back while performing sock management seated EOB.                      Pertinent Vitals/Pain Pain Assessment Pain Assessment: No/denies pain     Hand Dominance Left   Extremity/Trunk Assessment Upper Extremity Assessment Upper Extremity Assessment: Overall WFL for tasks assessed   Lower Extremity Assessment Lower Extremity Assessment: Overall WFL for tasks assessed       Communication Communication Communication: No difficulties   Cognition Arousal/Alertness: Awake/alert Behavior During Therapy: WFL for tasks assessed/performed Overall Cognitive Status: Within Functional Limits for tasks assessed          General Comments: Oriented x4, able to follow commands without difficulty, friendly                Home Living Family/patient expects to be discharged to:: Private  residence Living Arrangements: Spouse/significant other Available Help at Discharge: Family Type of Home: Mobile home Home Access: Stairs to enter Entrance Stairs-Number of Steps: 5 at one entrance, 7 at the other entrance   [place]: One level               [place]: None          Prior Functioning/Environment Prior Level of Function : Independent/Modified Independent             Mobility Comments: He was independent with ambulation. ADLs Comments: He was independent with ADLs, driving, and working part time.              OT Treatment/Interventions:   No further treatment needs identified       OT Frequency:  N/A       AM-PAC OT "6 Clicks" Daily Activity     Outcome Measure Help from another person eating meals?: None Help from another person taking care of personal grooming?: None Help from another person toileting, which includes using toliet, bedpan, or urinal?: None Help from another person bathing (including washing, rinsing, drying)?: A Little Help from another person to put on and taking off regular upper body clothing?: None Help from another person to put on and taking off regular lower body clothing?: None 6 Click Score: 23   End of Session Nurse Communication: Mobility status  Activity Tolerance: Patient tolerated treatment well Patient left: in chair;with call bell/phone within reach  OT Visit Diagnosis: Muscle weakness (generalized) (M62.81)                Time: 0945-1000 OT Time Calculation (min): 15 min Charges:  OT General Charges $OT Visit: 1 Visit OT Evaluation $OT Eval Low Complexity: 1 Low   Devra Stare L Shammond Arave, OTR/L 12/28/2022, 10:59 AM

## 2022-12-28 NOTE — Discharge Summary (Signed)
  Patient ID: Joe Jones [id number] 71 y.o. 09/08/1951  12/27/2022  Discharge date and time: 12/28/2022  Admitting Physician: [place]  Discharge Physician: [place]  Admission Diagnoses: Recurrent incisional hernia [K43.2] Patient Active Problem List   Diagnosis Date Noted   Recurrent incisional hernia 12/27/2022   Prostate cancer ([place]) 03/19/2021     Discharge Diagnoses: Recurrent hernia Patient Active Problem List   Diagnosis Date Noted   Recurrent incisional hernia 12/27/2022   Prostate cancer ([place]) 03/19/2021    Operations: Procedure(s): OPEN RECURRENT INCISIONAL HERNIA REPAIR WITH MESH AND COMPONENT SEPARATION AND REMOVAL OF MESH  Admission Condition: good  Discharged Condition: good  Indication for Admission: Recurrent hernia  Hospital Course: Open recurrent hernia repair  Consults: None  Significant Diagnostic Studies: None  Treatments: surgery: as above  Disposition: Home  Patient Instructions:  Allergies as of 12/28/2022   No Known Allergies      Medication List     STOP taking these medications    docusate sodium 100 MG capsule Commonly known as: COLACE   sulfamethoxazole-trimethoprim 800-160 MG tablet Commonly known as: BACTRIM DS   traMADol 50 MG tablet Commonly known as: Ultram       TAKE these medications    amLODipine 10 MG tablet Commonly known as: NORVASC Take 10 mg by mouth daily.   B-12 PO Take 1 tablet by mouth daily.   hydrochlorothiazide 25 MG tablet Commonly known as: HYDRODIURIL Take 25 mg by mouth every morning.   lisinopril 10 MG tablet Commonly known as: ZESTRIL Take 10 mg by mouth daily.   oxyCODONE-acetaminophen 5-325 MG tablet Commonly known as: Percocet Take 1 tablet by mouth every 4 (four) hours as needed for severe pain.   rosuvastatin 10 MG tablet Commonly known as: CRESTOR Take 10 mg by mouth at bedtime.        Activity: no heavy lifting for 4 weeks Diet:  regular diet Wound Care: keep wound clean and dry  Follow-up:  With Dr. Thermon Leyland in 4 weeks.  Signed: Nickola Major Ayeza [place]riault [facility], Bariatric, & Minimally Invasive Surgery [facility], [place]   12/28/2022, 8:46 AM

## 2022-12-28 NOTE — TOC CM/SW Note (Signed)
Transition of Care [facility]) Screening Note  Patient Details  Name: Joe Jones Date of Birth: [birth year]  Transition of Care [facility]) CM/SW Contact:    Sherie Don, LCSW Phone Number: 12/28/2022, 12:02 PM  Transition of Care Department [facility]) has reviewed patient and no TOC needs have been identified at this time. We will continue to monitor patient advancement through interdisciplinary progression rounds. If new patient transition needs arise, please place a TOC consult.

## 2022-12-29 NOTE — Anesthesia Postprocedure Evaluation (Signed)
Anesthesia Post Note  Patient: Joe Jones  Procedure(s) Performed: OPEN RECURRENT INCISIONAL HERNIA REPAIR WITH MESH AND COMPONENT SEPARATION AND REMOVAL OF MESH (Abdomen)     Patient location during evaluation: PACU Anesthesia Type: General Level of consciousness: awake and alert Pain management: pain level controlled Vital Signs Assessment: post-procedure vital signs reviewed and stable Respiratory status: spontaneous breathing, nonlabored ventilation, respiratory function stable and patient connected to nasal cannula oxygen Cardiovascular status: blood pressure returned to baseline and stable Postop Assessment: no apparent nausea or vomiting Anesthetic complications: no   No notable events documented.  Last Vitals:  Vitals:   12/28/22 0837 12/28/22 1219  BP: (!) 110/54 132/73  Pulse: 92 83  Resp: 16 16  Temp:  36.7 C  SpO2: 98% 99%    Last Pain:  Vitals:   12/28/22 0945  TempSrc:   PainSc: 0-No pain                 Shelie Lansing S

## 2023-05-19 DIAGNOSIS — R0981 Nasal congestion: Secondary | ICD-10-CM | POA: Diagnosis not present

## 2023-05-19 DIAGNOSIS — H6991 Unspecified Eustachian tube disorder, right ear: Secondary | ICD-10-CM | POA: Diagnosis not present

## 2023-05-19 DIAGNOSIS — H6121 Impacted cerumen, right ear: Secondary | ICD-10-CM | POA: Diagnosis not present

## 2023-05-19 DIAGNOSIS — R0982 Postnasal drip: Secondary | ICD-10-CM | POA: Diagnosis not present

## 2023-05-30 DIAGNOSIS — C61 Malignant neoplasm of prostate: Secondary | ICD-10-CM | POA: Diagnosis not present

## 2023-05-30 DIAGNOSIS — N529 Male erectile dysfunction, unspecified: Secondary | ICD-10-CM | POA: Diagnosis not present

## 2023-07-19 DIAGNOSIS — E7800 Pure hypercholesterolemia, unspecified: Secondary | ICD-10-CM | POA: Diagnosis not present

## 2023-07-19 DIAGNOSIS — Z6831 Body mass index (BMI) 31.0-31.9, adult: Secondary | ICD-10-CM | POA: Diagnosis not present

## 2023-07-19 DIAGNOSIS — I10 Essential (primary) hypertension: Secondary | ICD-10-CM | POA: Diagnosis not present

## 2023-08-16 DIAGNOSIS — H906 Mixed conductive and sensorineural hearing loss, bilateral: Secondary | ICD-10-CM | POA: Diagnosis not present

## 2023-08-16 DIAGNOSIS — L989 Disorder of the skin and subcutaneous tissue, unspecified: Secondary | ICD-10-CM | POA: Diagnosis not present

## 2023-08-16 DIAGNOSIS — H90A31 Mixed conductive and sensorineural hearing loss, unilateral, right ear with restricted hearing on the contralateral side: Secondary | ICD-10-CM | POA: Diagnosis not present

## 2023-12-06 DIAGNOSIS — C61 Malignant neoplasm of prostate: Secondary | ICD-10-CM | POA: Diagnosis not present

## 2023-12-06 DIAGNOSIS — N529 Male erectile dysfunction, unspecified: Secondary | ICD-10-CM | POA: Diagnosis not present

## 2023-12-06 DIAGNOSIS — N401 Enlarged prostate with lower urinary tract symptoms: Secondary | ICD-10-CM | POA: Diagnosis not present
